# Patient Record
Sex: Female | Born: 1995
Health system: Southern US, Community
[De-identification: ages and names within clinical notes are randomized; demographics above are authoritative.]

## PROBLEM LIST (undated history)

## (undated) ENCOUNTER — Inpatient Hospital Stay (HOSPITAL_COMMUNITY): Payer: Self-pay

## (undated) DIAGNOSIS — F32A Depression, unspecified: Secondary | ICD-10-CM

## (undated) DIAGNOSIS — F419 Anxiety disorder, unspecified: Secondary | ICD-10-CM

## (undated) DIAGNOSIS — I10 Essential (primary) hypertension: Secondary | ICD-10-CM

## (undated) DIAGNOSIS — D649 Anemia, unspecified: Secondary | ICD-10-CM

## (undated) HISTORY — PX: KNEE SURGERY: SHX244

---
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
## 2016-12-01 DIAGNOSIS — Z3201 Encounter for pregnancy test, result positive: Secondary | ICD-10-CM | POA: Diagnosis not present

## 2016-12-27 ENCOUNTER — Encounter: Payer: Self-pay | Admitting: Family Medicine

## 2016-12-27 ENCOUNTER — Ambulatory Visit (INDEPENDENT_AMBULATORY_CARE_PROVIDER_SITE_OTHER): Payer: BLUE CROSS/BLUE SHIELD | Admitting: Family Medicine

## 2016-12-27 VITALS — BP 106/91 | HR 66 | Wt 122.0 lb

## 2016-12-27 DIAGNOSIS — Z3400 Encounter for supervision of normal first pregnancy, unspecified trimester: Secondary | ICD-10-CM | POA: Insufficient documentation

## 2016-12-27 DIAGNOSIS — O3680X0 Pregnancy with inconclusive fetal viability, not applicable or unspecified: Secondary | ICD-10-CM

## 2016-12-27 DIAGNOSIS — Z113 Encounter for screening for infections with a predominantly sexual mode of transmission: Secondary | ICD-10-CM | POA: Diagnosis not present

## 2016-12-27 DIAGNOSIS — Z3491 Encounter for supervision of normal pregnancy, unspecified, first trimester: Secondary | ICD-10-CM

## 2016-12-27 NOTE — Progress Notes (Signed)
DATING AND VIABILITY SONOGRAM   Eulah Pontrmani Liou is a 21 y.o. year old G1P0 with LMP Patient's last menstrual period was 10/30/2016 (exact date). which would correlate to  5944w2d weeks gestation.  She has regular menstrual cycles.   She is here today for a confirmatory initial sonogram.    GESTATION: SINGLETON     FETAL ACTIVITY:          Heart rate         170 bpm          The fetus is active  GESTATIONAL AGE AND  BIOMETRICS:  Gestational criteria: Estimated Date of Delivery: 08/06/17 by LMP        CROWN RUMP LENGTH           2.09 cm         8-4weeks                                                                               AVERAGE EGA(BY THIS SCAN):  8week 4 days weeks  WORKING EDD:[birth year]18 Armandina StammerJennifer Howard 12/27/2016 9:01 AM

## 2016-12-27 NOTE — Progress Notes (Signed)
  Subjective:  Stacie Harding is a G1P0 3590w2d being seen today for her first obstetrical visit.  Her obstetrical history is insignificant. Patient does intend to breast feed. Pregnancy history fully reviewed.  Patient reports nausea.  BP (!) 106/91   Pulse 66   Wt 122 lb (55.3 kg)   LMP 10/30/2016 (Exact Date)   HISTORY: OB History  Gravida Para Term Preterm AB Living  1            SAB TAB Ectopic Multiple Live Births               # Outcome Date GA Lbr Len/2nd Weight Sex Delivery Anes PTL Lv  1 Current               History reviewed. No pertinent past medical history.  Past Surgical History:  Procedure Laterality Date  . KNEE SURGERY      Family History  Problem Relation Age of Onset  . Diabetes Maternal Grandmother   . Hypertension Maternal Grandmother   . Sudden death Neg Hx   . Hyperlipidemia Neg Hx   . Heart attack Neg Hx      Exam    Uterus:     Pelvic Exam:    Perineum: No Hemorrhoids, Normal Perineum   Vulva: normal, Bartholin's, Urethra, Skene's normal   Vagina:  normal mucosa, normal discharge   Cervix: no cervical motion tenderness, no lesions and nulliparous appearance   Adnexa: normal adnexa and no mass, fullness, tenderness   Bony Pelvis: gynecoid  System: Breast:  normal appearance, no masses or tenderness, Inspection negative, No nipple retraction or dimpling, No nipple discharge or bleeding, No axillary or supraclavicular adenopathy, Normal to palpation without dominant masses   Skin: normal coloration and turgor, no rashes    Neurologic: oriented, normal, negative   Extremities: normal strength, tone, and muscle mass, no deformities, no erythema, induration, or nodules, no evidence of joint effusion, ROM of all joints is normal   HEENT PERRLA and extra ocular movement intact   Mouth/Teeth mucous membranes moist, pharynx normal without lesions   Neck supple and no masses   Cardiovascular: regular rate and rhythm, no murmurs or gallops   Respiratory:  appears well, vitals normal, no respiratory distress, acyanotic, normal RR, ear and throat exam is normal, neck free of mass or lymphadenopathy, chest clear, no wheezing, crepitations, rhonchi, normal symmetric air entry   Abdomen: soft, non-tender; bowel sounds normal; no masses,  no organomegaly   Urinary: urethral meatus normal      Assessment:    Pregnancy: G1P0 Patient Active Problem List   Diagnosis Date Noted  . Supervision of normal first pregnancy, antepartum 12/27/2016  . Left knee injury 02/21/2012      Plan:   1. Supervision of normal first pregnancy, antepartum Genetic Screening discussed Quad Screen: requested.  Ultrasound discussed; fetal survey: requested.  Follow up in 4 weeks.  -Obstetric Panel, Including HIV - Inheritest Society Guided - CULTURE, URINE COMPREHENSIVE - GC/Chlamydia probe amp ([place]     Problem list reviewed and updated. 75% of 30 min visit spent on counseling and coordination of care.     Levie HeritageJacob J Stinson 12/27/2016

## 2016-12-27 NOTE — Patient Instructions (Signed)

## 2016-12-28 LAB — GC/CHLAMYDIA PROBE AMP (~~LOC~~) NOT AT ARMC
Chlamydia: NEGATIVE
Neisseria Gonorrhea: NEGATIVE

## 2016-12-31 ENCOUNTER — Encounter: Payer: Self-pay | Admitting: Family Medicine

## 2016-12-31 DIAGNOSIS — R8271 Bacteriuria: Secondary | ICD-10-CM | POA: Insufficient documentation

## 2016-12-31 LAB — CULTURE, URINE COMPREHENSIVE

## 2017-01-11 LAB — INHERITEST SOCIETY GUIDED

## 2017-01-11 LAB — OBSTETRIC PANEL, INCLUDING HIV
Antibody Screen: NEGATIVE
Basophils Absolute: 0 10*3/uL (ref 0.0–0.2)
Basos: 0 %
EOS (ABSOLUTE): 0 10*3/uL (ref 0.0–0.4)
Eos: 1 %
HEMOGLOBIN: 12.4 g/dL (ref 11.1–15.9)
HIV Screen 4th Generation wRfx: NONREACTIVE
Hematocrit: 37.5 % (ref 34.0–46.6)
Hepatitis B Surface Ag: NEGATIVE
Immature Grans (Abs): 0 10*3/uL (ref 0.0–0.1)
Immature Granulocytes: 0 %
Lymphocytes Absolute: 1 10*3/uL (ref 0.7–3.1)
Lymphs: 20 %
MCH: 30.9 pg (ref 26.6–33.0)
MCHC: 33.1 g/dL (ref 31.5–35.7)
MCV: 94 fL (ref 79–97)
MONOCYTES: 5 %
Monocytes Absolute: 0.2 10*3/uL (ref 0.1–0.9)
NEUTROS PCT: 74 %
Neutrophils Absolute: 3.8 10*3/uL (ref 1.4–7.0)
Platelets: 214 10*3/uL (ref 150–379)
RBC: 4.01 x10E6/uL (ref 3.77–5.28)
RDW: 13.1 % (ref 12.3–15.4)
RPR Ser Ql: NONREACTIVE
Rh Factor: POSITIVE
Rubella Antibodies, IGG: 10.6 index (ref >0.99)
WBC: 5.1 10*3/uL (ref 3.4–10.8)

## 2017-01-24 ENCOUNTER — Ambulatory Visit (INDEPENDENT_AMBULATORY_CARE_PROVIDER_SITE_OTHER): Payer: BLUE CROSS/BLUE SHIELD | Admitting: Family Medicine

## 2017-01-24 VITALS — BP 130/70 | HR 68 | Wt 121.0 lb

## 2017-01-24 DIAGNOSIS — Z3402 Encounter for supervision of normal first pregnancy, second trimester: Secondary | ICD-10-CM

## 2017-01-24 DIAGNOSIS — Z3400 Encounter for supervision of normal first pregnancy, unspecified trimester: Secondary | ICD-10-CM

## 2017-01-24 NOTE — Progress Notes (Signed)
   PRENATAL VISIT NOTE  Subjective:  Stacie Harding is a 21 y.o. G1P0 at [redacted]w[redacted]d being seen today for ongoing prenatal care.  She is currently monitored for the following issues for this low-risk pregnancy and has Left knee injury; Supervision of normal first pregnancy, antepartum; and GBS bacteriuria on her problem list.  Patient reports no complaints.  Contractions: Not present. Vag. Bleeding: None.   . Denies leaking of fluid.   The following portions of the patient's history were reviewed and updated as appropriate: allergies, current medications, past family history, past medical history, past social history, past surgical history and problem list. Problem list updated.  Objective:   Vitals:   01/24/17 1104  BP: 130/70  Pulse: 68  Weight: 121 lb (54.9 kg)    Fetal Status: Fetal Heart Rate (bpm): 155         General:  Alert, oriented and cooperative. Patient is in no acute distress.  Skin: Skin is warm and dry. No rash noted.   Cardiovascular: Normal heart rate noted  Respiratory: Normal respiratory effort, no problems with respiration noted  Abdomen: Soft, gravid, appropriate for gestational age.  Pain/Pressure: Absent     Pelvic: Cervical exam deferred        Extremities: Normal range of motion.  Edema: None  Mental Status:  Normal mood and affect. Normal behavior. Normal judgment and thought content.   Assessment and Plan:  Pregnancy: G1P0 at [redacted]w[redacted]d  1. Supervision of normal first pregnancy, antepartum FHT normal - [place] MFM OB COMP + 14 WK; Future  Preterm labor symptoms and general obstetric precautions including but not limited to vaginal bleeding, contractions, leaking of fluid and fetal movement were reviewed in detail with the patient. Please refer to After Visit Summary for other counseling recommendations.  Return in about 8 weeks (around 03/21/2017) for OB f/u.   Levie Heritage, DO

## 2017-01-28 DIAGNOSIS — F99 Mental disorder, not otherwise specified: Secondary | ICD-10-CM | POA: Diagnosis not present

## 2017-01-29 ENCOUNTER — Encounter: Payer: Self-pay | Admitting: Family Medicine

## 2017-02-06 DIAGNOSIS — F99 Mental disorder, not otherwise specified: Secondary | ICD-10-CM | POA: Diagnosis not present

## 2017-02-13 DIAGNOSIS — F99 Mental disorder, not otherwise specified: Secondary | ICD-10-CM | POA: Diagnosis not present

## 2017-02-20 DIAGNOSIS — F99 Mental disorder, not otherwise specified: Secondary | ICD-10-CM | POA: Diagnosis not present

## 2017-02-27 DIAGNOSIS — F99 Mental disorder, not otherwise specified: Secondary | ICD-10-CM | POA: Diagnosis not present

## 2017-02-28 ENCOUNTER — Encounter: Payer: Self-pay | Admitting: Family Medicine

## 2017-03-05 ENCOUNTER — Encounter (HOSPITAL_COMMUNITY): Payer: Self-pay | Admitting: Family Medicine

## 2017-03-12 ENCOUNTER — Other Ambulatory Visit: Payer: Self-pay | Admitting: Family Medicine

## 2017-03-12 ENCOUNTER — Ambulatory Visit (HOSPITAL_COMMUNITY)
Admission: RE | Admit: 2017-03-12 | Discharge: 2017-03-12 | Disposition: A | Discharge status: Home / Self Care | Payer: BLUE CROSS/BLUE SHIELD | Source: Ambulatory Visit | Attending: Family Medicine | Admitting: Family Medicine

## 2017-03-12 DIAGNOSIS — Z3689 Encounter for other specified antenatal screening: Secondary | ICD-10-CM | POA: Diagnosis not present

## 2017-03-12 DIAGNOSIS — Z3400 Encounter for supervision of normal first pregnancy, unspecified trimester: Secondary | ICD-10-CM

## 2017-03-12 DIAGNOSIS — Z3A19 19 weeks gestation of pregnancy: Secondary | ICD-10-CM | POA: Insufficient documentation

## 2017-03-14 DIAGNOSIS — F99 Mental disorder, not otherwise specified: Secondary | ICD-10-CM | POA: Diagnosis not present

## 2017-03-20 DIAGNOSIS — F99 Mental disorder, not otherwise specified: Secondary | ICD-10-CM | POA: Diagnosis not present

## 2017-03-21 ENCOUNTER — Ambulatory Visit (INDEPENDENT_AMBULATORY_CARE_PROVIDER_SITE_OTHER): Payer: BLUE CROSS/BLUE SHIELD | Admitting: Family Medicine

## 2017-03-21 VITALS — BP 107/66 | HR 71 | Wt 136.0 lb

## 2017-03-21 DIAGNOSIS — Z3400 Encounter for supervision of normal first pregnancy, unspecified trimester: Secondary | ICD-10-CM

## 2017-03-21 NOTE — Progress Notes (Signed)
   PRENATAL VISIT NOTE  Subjective:  Stacie Harding is a 21 y.o. G1P0 at 4040w2d being seen today for ongoing prenatal care.  She is currently monitored for the following issues for this low-risk pregnancy and has Left knee injury; Supervision of normal first pregnancy, antepartum; and GBS bacteriuria on her problem list.  Patient reports no complaints.  Contractions: Not present. Vag. Bleeding: None.  Movement: Present. Denies leaking of fluid.   The following portions of the patient's history were reviewed and updated as appropriate: allergies, current medications, past family history, past medical history, past social history, past surgical history and problem list. Problem list updated.  Objective:   Vitals:   03/21/17 0846  BP: 107/66  Pulse: 71  Weight: 136 lb (61.7 kg)    Fetal Status: Fetal Heart Rate (bpm): 150 Fundal Height: 21 cm Movement: Present     General:  Alert, oriented and cooperative. Patient is in no acute distress.  Skin: Skin is warm and dry. No rash noted.   Cardiovascular: Normal heart rate noted  Respiratory: Normal respiratory effort, no problems with respiration noted  Abdomen: Soft, gravid, appropriate for gestational age.  Pain/Pressure: Absent     Pelvic: Cervical exam deferred        Extremities: Normal range of motion.  Edema: None  Mental Status:  Normal mood and affect. Normal behavior. Normal judgment and thought content.   Assessment and Plan:  Pregnancy: G1P0 at 6940w2d  1. Supervision of normal first pregnancy, antepartum FHT and FH normal. 28 week labs next visit. Declined quad  Preterm labor symptoms and general obstetric precautions including but not limited to vaginal bleeding, contractions, leaking of fluid and fetal movement were reviewed in detail with the patient. Please refer to After Visit Summary for other counseling recommendations.  Return in about 8 weeks (around 05/16/2017) for OB f/u, 2 hr GTT.   Levie HeritageJacob J Amita Atayde, DO

## 2017-04-17 DIAGNOSIS — F99 Mental disorder, not otherwise specified: Secondary | ICD-10-CM | POA: Diagnosis not present

## 2017-04-30 ENCOUNTER — Telehealth: Payer: Self-pay

## 2017-04-30 NOTE — Telephone Encounter (Signed)
Patient needs dental note.Fax[place] pregnancy dental letter to [facility] [place]. Armandina StammerJennifer Charly Hunton RN

## 2017-05-17 ENCOUNTER — Encounter: Payer: Self-pay | Admitting: Obstetrics and Gynecology

## 2017-05-17 ENCOUNTER — Ambulatory Visit (INDEPENDENT_AMBULATORY_CARE_PROVIDER_SITE_OTHER): Payer: BLUE CROSS/BLUE SHIELD | Admitting: Obstetrics and Gynecology

## 2017-05-17 VITALS — BP 114/66 | HR 78 | Wt 142.0 lb

## 2017-05-17 DIAGNOSIS — Z3490 Encounter for supervision of normal pregnancy, unspecified, unspecified trimester: Secondary | ICD-10-CM | POA: Diagnosis not present

## 2017-05-17 DIAGNOSIS — Z3400 Encounter for supervision of normal first pregnancy, unspecified trimester: Secondary | ICD-10-CM

## 2017-05-17 DIAGNOSIS — Z3491 Encounter for supervision of normal pregnancy, unspecified, first trimester: Secondary | ICD-10-CM | POA: Diagnosis not present

## 2017-05-17 DIAGNOSIS — R8271 Bacteriuria: Secondary | ICD-10-CM

## 2017-05-17 NOTE — Progress Notes (Signed)
Patient recently had tooth pulled and on antibiotics and pain medications from that. Armandina StammerJennifer Howard RNBSN

## 2017-05-17 NOTE — Progress Notes (Signed)
   PRENATAL VISIT NOTE  Subjective:  Stacie Harding is a 21 y.o. G1P0 at 2026w3d being seen today for ongoing prenatal care.  She is currently monitored for the following issues for this low-risk pregnancy and has Left knee injury; Supervision of normal first pregnancy, antepartum; and GBS bacteriuria on their problem list.  Patient reports no complaints.  Contractions: Not present. Vag. Bleeding: None.  Movement: Present. Denies leaking of fluid.   The following portions of the patient's history were reviewed and updated as appropriate: allergies, current medications, past family history, past medical history, past social history, past surgical history and problem list. Problem list updated.  Objective:   Vitals:   05/17/17 0830  BP: 114/66  Pulse: 78  Weight: 142 lb (64.4 kg)    Fetal Status: Fetal Heart Rate (bpm): 140 Fundal Height: 28 cm Movement: Present     General:  Alert, oriented and cooperative. Patient is in no acute distress.  Skin: Skin is warm and dry. No rash noted.   Cardiovascular: Normal heart rate noted  Respiratory: Normal respiratory effort, no problems with respiration noted  Abdomen: Soft, gravid, appropriate for gestational age.  Pain/Pressure: Absent     Pelvic: Cervical exam deferred        Extremities: Normal range of motion.  Edema: None  Mental Status:  Normal mood and affect. Normal behavior. Normal judgment and thought content.   Assessment and Plan:  Pregnancy: G1P0 at 8126w3d  1. Prenatal care, antepartum Patient is doing well without complaints Third trimester labs, glucola today Patient declined tdap - Glucose Tolerance, 2 Hours w/1 Hour - CBC - RPR - HIV antibody (with reflex)  2. Supervision of normal first pregnancy, antepartum   3. GBS bacteriuria Will provide prophylaxis in labor  Preterm labor symptoms and general obstetric precautions including but not limited to vaginal bleeding, contractions, leaking of fluid and fetal movement  were reviewed in detail with the patient. Please refer to After Visit Summary for other counseling recommendations.  Return in about 4 weeks (around 06/14/2017) for ROB.   Catalina AntiguaPeggy Tyre Beaver, MD

## 2017-05-18 LAB — CBC
Hematocrit: 31.4 % — ABNORMAL LOW (ref 34.0–46.6)
Hemoglobin: 10.7 g/dL — ABNORMAL LOW (ref 11.1–15.9)
MCH: 31 pg (ref 26.6–33.0)
MCHC: 34.1 g/dL (ref 31.5–35.7)
MCV: 91 fL (ref 79–97)
Platelets: 216 10*3/uL (ref 150–379)
RBC: 3.45 x10E6/uL — ABNORMAL LOW (ref 3.77–5.28)
RDW: 13.8 % (ref 12.3–15.4)
WBC: 5.1 10*3/uL (ref 3.4–10.8)

## 2017-05-18 LAB — GLUCOSE TOLERANCE, 2 HOURS W/ 1HR
GLUCOSE, 2 HOUR: 79 mg/dL (ref 65–152)
Glucose, 1 hour: 83 mg/dL (ref 65–179)
Glucose, Fasting: 68 mg/dL (ref 65–91)

## 2017-05-18 LAB — SYPHILIS: RPR W/REFLEX TO RPR TITER AND TREPONEMAL ANTIBODIES, TRADITIONAL SCREENING AND DIAGNOSIS ALGORITHM: RPR Ser Ql: NONREACTIVE

## 2017-05-18 LAB — HIV ANTIBODY (ROUTINE TESTING W REFLEX): HIV Screen 4th Generation wRfx: NONREACTIVE

## 2017-05-20 DIAGNOSIS — K090 Developmental odontogenic cysts: Secondary | ICD-10-CM | POA: Diagnosis not present

## 2017-06-04 NOTE — L&D Delivery Note (Signed)
Patient: Stacie Harding MRN: [id number]  GBS status: positive, adequate IAP given (penicllin x 4)  Patient is a 22 y.o. now G1P1001 s/p NSVD at 1372w6d, who was admitted for PROM. SROM ~ 50h 728m prior to delivery with clear fluid. S/p IOL with cytotec and Pitocin  Delivery Note At 12:23 AM a viable female was delivered via  (Presentation: vertex;  OA).  APGAR: 9, 9; weight  pending   Placenta status: intact, 3-vessek.  Cord: 3-vessel  Patient pushed for about 15 minutes. Arrived in room, head crowning, and spontaneously delivered OA. No nuchal cord present. Shoulders and body delivered in usual fashion. Infant with spontaneous cry, placed on mother's abdomen, dried and bulb suctioned. Cord clamped x 2 after 1-minute delay, and cut by family member. Cord blood drawn. Placenta delivered spontaneously with gentle cord traction. Fundus firm with massage and Pitocin. Perineum inspected and found to have no lacerations  Anesthesia:  Epidural Episiotomy:  none Lacerations:  none Suture Repair:  none Est. Blood Loss (mL):  150  Mom to postpartum.  Baby to Couplet care / Skin to Skin.  Raynelle FanningJulie P. Gracelee Stemmler, MD OB Fellow 08/05/17, 12:42 AM

## 2017-06-14 ENCOUNTER — Ambulatory Visit (INDEPENDENT_AMBULATORY_CARE_PROVIDER_SITE_OTHER): Payer: Medicaid Other | Admitting: Obstetrics & Gynecology

## 2017-06-14 ENCOUNTER — Encounter: Payer: Self-pay | Admitting: Obstetrics & Gynecology

## 2017-06-14 DIAGNOSIS — Z3400 Encounter for supervision of normal first pregnancy, unspecified trimester: Secondary | ICD-10-CM

## 2017-06-14 NOTE — Progress Notes (Signed)
   PRENATAL VISIT NOTE  Subjective:  Stacie Harding is a 22 y.o. G1P0 at 3839w3d being seen today for ongoing prenatal care.  She is currently monitored for the following issues for this low-risk pregnancy and has Left knee injury; Supervision of normal first pregnancy, antepartum; and GBS bacteriuria on their problem list.  Patient reports no complaints.  Contractions: Irritability. Vag. Bleeding: None.  Movement: Present. Denies leaking of fluid.   The following portions of the patient's history were reviewed and updated as appropriate: allergies, current medications, past family history, past medical history, past social history, past surgical history and problem list. Problem list updated.  Objective:   Vitals:   06/14/17 0837  BP: 114/62  Pulse: 79  Weight: 151 lb (68.5 kg)    Fetal Status: Fetal Heart Rate (bpm): 140   Movement: Present     General:  Alert, oriented and cooperative. Patient is in no acute distress.  Skin: Skin is warm and dry. No rash noted.   Cardiovascular: Normal heart rate noted  Respiratory: Normal respiratory effort, no problems with respiration noted  Abdomen: Soft, gravid, appropriate for gestational age.  Pain/Pressure: Absent     Pelvic: Cervical exam deferred        Extremities: Normal range of motion.     Mental Status:  Normal mood and affect. Normal behavior. Normal judgment and thought content.   Assessment and Plan:  Pregnancy: G1P0 at 7239w3d  1. Supervision of normal first pregnancy, antepartum Pt to receive Tdap and flu vaccine today.  She is undecided re a peditrician.    Preterm labor symptoms and general obstetric precautions including but not limited to vaginal bleeding, contractions, leaking of fluid and fetal movement were reviewed in detail with the patient. Please refer to After Visit Summary for other counseling recommendations.  Return in about 4 weeks (around 07/12/2017).   Willodean Rosenthalarolyn Harraway-Smith, MD

## 2017-07-12 ENCOUNTER — Encounter: Payer: Medicaid Other | Admitting: Family Medicine

## 2017-07-12 ENCOUNTER — Other Ambulatory Visit (HOSPITAL_COMMUNITY)
Admission: RE | Admit: 2017-07-12 | Discharge: 2017-07-12 | Disposition: A | Discharge status: Home / Self Care | Payer: Medicaid Other | Source: Ambulatory Visit | Attending: Family Medicine | Admitting: Family Medicine

## 2017-07-12 ENCOUNTER — Ambulatory Visit (INDEPENDENT_AMBULATORY_CARE_PROVIDER_SITE_OTHER): Payer: Medicaid Other | Admitting: Family Medicine

## 2017-07-12 VITALS — BP 120/70 | HR 96 | Wt 157.0 lb

## 2017-07-12 DIAGNOSIS — Z3A00 Weeks of gestation of pregnancy not specified: Secondary | ICD-10-CM | POA: Diagnosis not present

## 2017-07-12 DIAGNOSIS — Z3400 Encounter for supervision of normal first pregnancy, unspecified trimester: Secondary | ICD-10-CM

## 2017-07-12 DIAGNOSIS — R8271 Bacteriuria: Secondary | ICD-10-CM

## 2017-07-12 NOTE — Progress Notes (Signed)
Pt states that she has had a cold this week. Pt states currently just having cough.

## 2017-07-12 NOTE — Progress Notes (Signed)
   PRENATAL VISIT NOTE  Subjective:  Stacie Harding is a 22 y.o. G1P0 at 2254w3d being seen today for ongoing prenatal care.  She is currently monitored for the following issues for this low-risk pregnancy and has Left knee injury; Supervision of normal first pregnancy, antepartum; and GBS bacteriuria on their problem list.  Patient reports mild constant pain in right abdomen..  Contractions: Irregular. Vag. Bleeding: None.  Movement: Present. Denies leaking of fluid.   The following portions of the patient's history were reviewed and updated as appropriate: allergies, current medications, past family history, past medical history, past social history, past surgical history and problem list. Problem list updated.  Objective:   Vitals:   07/12/17 1036  BP: 120/70  Pulse: 96  Weight: 157 lb (71.2 kg)    Fetal Status: Fetal Heart Rate (bpm): 135   Movement: Present     General:  Alert, oriented and cooperative. Patient is in no acute distress.  Skin: Skin is warm and dry. No rash noted.   Cardiovascular: Normal heart rate noted  Respiratory: Normal respiratory effort, no problems with respiration noted  Abdomen: Soft, gravid, appropriate for gestational age.  Pain/Pressure: Present  Tenderness in RLQ. No rebound or guarding.   Pelvic: Cervical exam deferred        Extremities: Normal range of motion.     Mental Status:  Normal mood and affect. Normal behavior. Normal judgment and thought content.   Assessment and Plan:  Pregnancy: G1P0 at 5054w3d  1. Supervision of normal first pregnancy, antepartum FHT and FH normal.   2. GBS bacteriuria Intrapartum prophylaxis  3. Abd Pain in pregnancy I don't think this is appendicitis as pain is lower than normal for appendicitis at this GA. ? Muscle strain. Will watch for now. Pt advised to return or call if worsens.  Preterm labor symptoms and general obstetric precautions including but not limited to vaginal bleeding, contractions, leaking of  fluid and fetal movement were reviewed in detail with the patient. Please refer to After Visit Summary for other counseling recommendations.  Return in about 2 weeks (around 07/26/2017) for OB f/u.   Levie HeritageJacob J Stinson, DO

## 2017-07-12 NOTE — Progress Notes (Signed)
Pt states she was told she needed an u/s for size>dates. Pt advised to discuss with provider today.

## 2017-07-22 LAB — URINE CYTOLOGY ANCILLARY ONLY
Chlamydia: NEGATIVE
Neisseria Gonorrhea: NEGATIVE

## 2017-07-26 ENCOUNTER — Ambulatory Visit (INDEPENDENT_AMBULATORY_CARE_PROVIDER_SITE_OTHER): Payer: Medicaid Other | Admitting: Family Medicine

## 2017-07-26 ENCOUNTER — Encounter: Payer: Self-pay | Admitting: Family Medicine

## 2017-07-26 VITALS — BP 124/79 | HR 74 | Wt 162.0 lb

## 2017-07-26 DIAGNOSIS — R8271 Bacteriuria: Secondary | ICD-10-CM

## 2017-07-26 DIAGNOSIS — Z3400 Encounter for supervision of normal first pregnancy, unspecified trimester: Secondary | ICD-10-CM

## 2017-07-26 NOTE — Progress Notes (Signed)
   PRENATAL VISIT NOTE  Subjective:  Stacie Harding is a 22 y.o. G1P0 at 5742w3d being seen today for ongoing prenatal care.  She is currently monitored for the following issues for this low-risk pregnancy and has Left knee injury; Supervision of normal first pregnancy, antepartum; and GBS bacteriuria on their problem list.  Patient reports no complaints.  Contractions: Irritability. Vag. Bleeding: None.  Movement: Present. Denies leaking of fluid.   The following portions of the patient's history were reviewed and updated as appropriate: allergies, current medications, past family history, past medical history, past social history, past surgical history and problem list. Problem list updated.  Objective:   Vitals:   07/26/17 1019  BP: 124/79  Pulse: 74  Weight: 162 lb (73.5 kg)    Fetal Status: Fetal Heart Rate (bpm): 145 Fundal Height: 38 cm Movement: Present     General:  Alert, oriented and cooperative. Patient is in no acute distress.  Skin: Skin is warm and dry. No rash noted.   Cardiovascular: Normal heart rate noted  Respiratory: Normal respiratory effort, no problems with respiration noted  Abdomen: Soft, gravid, appropriate for gestational age.  Pain/Pressure: Present     Pelvic: Cervical exam deferred        Extremities: Normal range of motion.  Edema: None  Mental Status:  Normal mood and affect. Normal behavior. Normal judgment and thought content.   Assessment and Plan:  Pregnancy: G1P0 at 5542w3d  1. Supervision of normal first pregnancy, antepartum FHT and FH normal  2. GBS bacteriuria Intrapartum prophylaxis  Term labor symptoms and general obstetric precautions including but not limited to vaginal bleeding, contractions, leaking of fluid and fetal movement were reviewed in detail with the patient. Please refer to After Visit Summary for other counseling recommendations.  No Follow-up on file.   Levie HeritageJacob J Marque Bango, DO

## 2017-07-31 ENCOUNTER — Other Ambulatory Visit: Payer: Self-pay

## 2017-07-31 ENCOUNTER — Encounter (HOSPITAL_BASED_OUTPATIENT_CLINIC_OR_DEPARTMENT_OTHER): Payer: Self-pay | Admitting: Emergency Medicine

## 2017-07-31 ENCOUNTER — Inpatient Hospital Stay (HOSPITAL_BASED_OUTPATIENT_CLINIC_OR_DEPARTMENT_OTHER)
Admission: EM | Admit: 2017-07-31 | Discharge: 2017-08-01 | Disposition: A | Discharge status: Other Acute Inpatient Hospital | Payer: Medicaid Other | Attending: Obstetrics & Gynecology | Admitting: Obstetrics & Gynecology

## 2017-07-31 DIAGNOSIS — Z3A37 37 weeks gestation of pregnancy: Secondary | ICD-10-CM | POA: Insufficient documentation

## 2017-07-31 DIAGNOSIS — R1084 Generalized abdominal pain: Secondary | ICD-10-CM | POA: Insufficient documentation

## 2017-07-31 DIAGNOSIS — Z720 Tobacco use: Secondary | ICD-10-CM | POA: Insufficient documentation

## 2017-07-31 DIAGNOSIS — O471 False labor at or after 37 completed weeks of gestation: Secondary | ICD-10-CM | POA: Insufficient documentation

## 2017-07-31 DIAGNOSIS — O26893 Other specified pregnancy related conditions, third trimester: Secondary | ICD-10-CM

## 2017-07-31 DIAGNOSIS — O9989 Other specified diseases and conditions complicating pregnancy, childbirth and the puerperium: Secondary | ICD-10-CM | POA: Diagnosis present

## 2017-07-31 DIAGNOSIS — R109 Unspecified abdominal pain: Secondary | ICD-10-CM

## 2017-07-31 MED ORDER — SODIUM CHLORIDE 0.9 % IV BOLUS (SEPSIS)
1000.0000 mL | Freq: Once | INTRAVENOUS | Status: AC
  Administered 2017-07-31: 1000 mL via INTRAVENOUS

## 2017-07-31 NOTE — MAU Note (Signed)
Pt transferred via [facility]. Pt reports contractions every 3-4 mins since 6:30pm. Pt denies LOF or vaginal bleeding. Reports good fetal movement. States the ED dr said she was 2cm

## 2017-07-31 NOTE — Progress Notes (Addendum)
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

## 2017-07-31 NOTE — ED Triage Notes (Signed)
Pt is [redacted] weeks pregnant and has been having contractions since 630 and are about every 4 mins per pt.

## 2017-07-31 NOTE — ED Notes (Signed)
[facility] contacted and sending a truck.

## 2017-07-31 NOTE — ED Notes (Signed)
Report given to [facility] ETA 20 min 

## 2017-07-31 NOTE — ED Notes (Signed)
Called [facility] Delice Bison([facility]) for transport to [facility].

## 2017-07-31 NOTE — ED Provider Notes (Signed)
Emergency Department Provider Note   I have reviewed the triage vital signs and the nursing notes.   HISTORY  Chief Complaint Contractions   HPI Stacie Harding is a 22 y.o. female G1P0 at 6739 weeks presents to the ED with frequent contraction pain (every 3-4 minutes) over the last 3 hours. Patient is followed by Dr. Adrian BlackwaterStinson with OB. No complications in the pregnancy thus far. Patient denies any vaginal bleeding or rush of fluid. Cramping abdominal pain is intermittent and severe. Denies dysuria. Patient has been at home with contractions for 3 hours alone. When mom arrived she insisted on transport to the ED and [facility]. Denies any rectal pain/pressure. Denies feeling need to push.   History reviewed. No pertinent past medical history.  Patient Active Problem List   Diagnosis Date Noted  . GBS bacteriuria 12/31/2016  . Supervision of normal first pregnancy, antepartum 12/27/2016  . Left knee injury 02/21/2012    Past Surgical History:  Procedure Laterality Date  . KNEE SURGERY        Allergies Patient has no known allergies.  Family History  Problem Relation Age of Onset  . Diabetes Maternal Grandmother   . Hypertension Maternal Grandmother   . Sudden death Neg Hx   . Hyperlipidemia Neg Hx   . Heart attack Neg Hx     Social History Social History   Tobacco Use  . Smoking status: Light Tobacco Smoker  . Smokeless tobacco: Never Used  Substance Use Topics  . Alcohol use: No  . Drug use: No    Review of Systems  Constitutional: No fever/chills Eyes: No visual changes. ENT: No sore throat. Cardiovascular: Denies chest pain. Respiratory: Denies shortness of breath. Gastrointestinal: Positive cramping abdominal pain.  No nausea, no vomiting.  No diarrhea.  No constipation. Genitourinary: Negative for dysuria. Musculoskeletal: Negative for back pain. Skin: Negative for rash. Neurological: Negative for  headaches, focal weakness or numbness.  10-point ROS otherwise negative.  ____________________________________________   PHYSICAL EXAM:  VITAL SIGNS: ED Triage Vitals  Enc Vitals Group     BP 07/31/17 2120 140/75     Pulse Rate 07/31/17 2120 89     Resp 07/31/17 2120 16     Temp 07/31/17 2115 97.8 F (36.6 C)     Temp Source 07/31/17 2115 Oral     SpO2 07/31/17 2120 100 %     Pain Score 07/31/17 2117 3   Constitutional: Alert and oriented. Appears slightly uncomfortable but no acute distress.  Eyes: Conjunctivae are normal.  Head: Atraumatic. Nose: No congestion/rhinnorhea. Mouth/Throat: Mucous membranes are moist.  Neck: No stridor.  Cardiovascular: Normal rate, regular rhythm. Good peripheral circulation. Grossly normal heart sounds.   Respiratory: Normal respiratory effort.  No retractions. Lungs CTAB. Gastrointestinal: Soft with gravid abdomen. Sterile digital exam shows a cervix that is approximately 2 cm and 50% effaced.  Musculoskeletal: No lower extremity tenderness nor edema. No gross deformities of extremities. Neurologic:  Normal speech and language. No gross focal neurologic deficits are appreciated.  Skin:  Skin is warm, dry and intact. No rash noted.  ____________________________________________   LABS (all labs ordered are listed, but only abnormal results are displayed)  Labs Reviewed  URINALYSIS, ROUTINE W REFLEX MICROSCOPIC   ____________________________________________   PROCEDURES  Procedure(s) performed:   Procedures  EMERGENCY DEPARTMENT US PREGNANCY "Study: Limited Ultrasound of the Pelvis for Pregnancy"  INDICATIONS:Pregnancy(required) and Abdominal or pelvic pain Multiple views of the uterus  and pelvic cavity were obtained in real-time with a multi-frequency probe.  APPROACH:Transabdominal   PERFORMED BY: Myself  LIMITATIONS: none  PREGNANCY FREE FLUID: None  ADNEXAL FINDINGS:Left ovary not seen and Right ovary not  seen  PREGNANCY FINDINGS: Head visualized down and seated in the pelvis.   INTERPRETATION: Fetal heart activity seen with head down.   GESTATIONAL AGE, ESTIMATE: 39 wks  FETAL HEART RATE: 155  CPT Codes:  76815-26 (transabdominal OB)  768-26-52 (transvaginal OB, Reduced level of service for incomplete exam)  ____________________________________________   INITIAL IMPRESSION / ASSESSMENT AND PLAN / ED COURSE  Pertinent labs & imaging results that were available during my care of the patient were reviewed by me and considered in my medical decision making (see chart for details).  At [redacted] weeks pregnant with 3 hours of contractions every 3-4 minutes.  On my sterile bimanual exam she is 2 cm dilated and 50% effaced.  No pooling fluid in the vaginal vault and no blood.  I spoke with the OB/GYN who [facility] to [facility]. Will call for transport. Obtain UA if able and will start IVF bolus.   Patient reevaluated immediately prior to discharge with EMS.  Contractions have not increased.  Good fetal heart variability.  No concern for imminent delivery.  Patient clear for transport.  ___________________________________________  FINAL CLINICAL IMPRESSION(S) / ED DIAGNOSES  Final diagnoses:  Abdominal pain during pregnancy in third trimester    MEDICATIONS GIVEN DURING THIS VISIT:  Medications  sodium chloride 0.9 % bolus 1,000 mL (1,000 mLs Intravenous New Bag/Given 07/31/17 2127)    Note:  This document was prepared using Dragon voice recognition software and may include unintentional dictation errors.  Alona Bene, MD Emergency Medicine   Janese Radabaugh, Arlyss Repress, MD 07/31/17 [phone]

## 2017-07-31 NOTE — ED Notes (Signed)
ED Provider at bedside. 

## 2017-08-01 ENCOUNTER — Ambulatory Visit (INDEPENDENT_AMBULATORY_CARE_PROVIDER_SITE_OTHER): Payer: Medicaid Other | Admitting: Family Medicine

## 2017-08-01 VITALS — BP 135/81 | HR 81 | Wt 166.0 lb

## 2017-08-01 DIAGNOSIS — R8271 Bacteriuria: Secondary | ICD-10-CM

## 2017-08-01 DIAGNOSIS — Z3400 Encounter for supervision of normal first pregnancy, unspecified trimester: Secondary | ICD-10-CM

## 2017-08-01 DIAGNOSIS — Z3403 Encounter for supervision of normal first pregnancy, third trimester: Secondary | ICD-10-CM

## 2017-08-01 NOTE — Discharge Instructions (Signed)
Vaginal Delivery Vaginal delivery means that you will give birth by pushing your baby out of your birth canal (vagina). A team of health care providers will help you before, during, and after vaginal delivery. Birth experiences are unique for every woman and every pregnancy, and birth experiences vary depending on where you choose to give birth. What should I do to prepare for my baby's birth? Before your baby is born, it is important to talk with your health care provider about:  Your labor and delivery preferences. These may include: ? Medicines that you may be given. ? How you will manage your pain. This might include non-medical pain relief techniques or injectable pain relief such as epidural analgesia. ? How you and your baby will be monitored during labor and delivery. ? Who may be in the labor and delivery room with you. ? Your feelings about surgical delivery of your baby (cesarean delivery, or C-section) if this becomes necessary. ? Your feelings about receiving donated blood through an IV tube (blood transfusion) if this becomes necessary.  Whether you are able: ? To take pictures or videos of the birth. ? To eat during labor and delivery. ? To move around, walk, or change positions during labor and delivery.  What to expect after your baby is born, such as: ? Whether delayed umbilical cord clamping and cutting is offered. ? Who will care for your baby right after birth. ? Medicines or tests that may be recommended for your baby. ? Whether breastfeeding is supported in your hospital or birth center. ? How long you will be in the hospital or birth center.  How any medical conditions you have may affect your baby or your labor and delivery experience.  To prepare for your baby's birth, you should also:  Attend all of your health care visits before delivery (prenatal visits) as recommended by your health care provider. This is important.  Prepare your home for your baby's  arrival. Make sure that you have: ? Diapers. ? Baby clothing. ? Feeding equipment. ? Safe sleeping arrangements for you and your baby.  Install a car seat in your vehicle. Have your car seat checked by a certified car seat installer to make sure that it is installed safely.  Think about who will help you with your new baby at home for at least the first several weeks after delivery.  What can I expect when I arrive at the birth center or hospital? Once you are in labor and have been admitted into the hospital or birth center, your health care provider may:  Review your pregnancy history and any concerns you have.  Insert an IV tube into one of your veins. This is used to give you fluids and medicines.  Check your blood pressure, pulse, temperature, and heart rate (vital signs).  Check whether your bag of water (amniotic sac) has broken (ruptured).  Talk with you about your birth plan and discuss pain control options.  Monitoring Your health care provider may monitor your contractions (uterine monitoring) and your baby's heart rate (fetal monitoring). You may need to be monitored:  Often, but not continuously (intermittently).  All the time or for long periods at a time (continuously). Continuous monitoring may be needed if: ? You are taking certain medicines, such as medicine to relieve pain or make your contractions stronger. ? You have pregnancy or labor complications.  Monitoring may be done by:  Placing a special stethoscope or a handheld monitoring device on your abdomen to   check your baby's heartbeat, and feeling your abdomen for contractions. This method of monitoring does not continuously record your baby's heartbeat or your contractions.  Placing monitors on your abdomen (external monitors) to record your baby's heartbeat and the frequency and length of contractions. You may not have to wear external monitors all the time.  Placing monitors inside of your uterus  (internal monitors) to record your baby's heartbeat and the frequency, length, and strength of your contractions. ? Your health care provider may use internal monitors if he or she needs more information about the strength of your contractions or your baby's heart rate. ? Internal monitors are put in place by passing a thin, flexible wire through your vagina and into your uterus. Depending on the type of monitor, it may remain in your uterus or on your baby's head until birth. ? Your health care provider will discuss the benefits and risks of internal monitoring with you and will ask for your permission before inserting the monitors.  Telemetry. This is a type of continuous monitoring that can be done with external or internal monitors. Instead of having to stay in bed, you are able to move around during telemetry. Ask your health care provider if telemetry is an option for you.  Physical exam Your health care provider may perform a physical exam. This may include:  Checking whether your baby is positioned: ? With the head toward your vagina (head-down). This is most common. ? With the head toward the top of your uterus (head-up or breech). If your baby is in a breech position, your health care provider may try to turn your baby to a head-down position so you can deliver vaginally. If it does not seem that your baby can be born vaginally, your provider may recommend surgery to deliver your baby. In rare cases, you may be able to deliver vaginally if your baby is head-up (breech delivery). ? Lying sideways (transverse). Babies that are lying sideways cannot be delivered vaginally.  Checking your cervix to determine: ? Whether it is thinning out (effacing). ? Whether it is opening up (dilating). ? How low your baby has moved into your birth canal.  What are the three stages of labor and delivery?  Normal labor and delivery is divided into the following three stages: Stage 1  Stage 1 is the  longest stage of labor, and it can last for hours or days. Stage 1 includes: ? Early labor. This is when contractions may be irregular, or regular and mild. Generally, early labor contractions are more than 10 minutes apart. ? Active labor. This is when contractions get longer, more regular, more frequent, and more intense. ? The transition phase. This is when contractions happen very close together, are very intense, and may last longer than during any other part of labor.  Contractions generally feel mild, infrequent, and irregular at first. They get stronger, more frequent (about every 2-3 minutes), and more regular as you progress from early labor through active labor and transition.  Many women progress through stage 1 naturally, but you may need help to continue making progress. If this happens, your health care provider may talk with you about: ? Rupturing your amniotic sac if it has not ruptured yet. ? Giving you medicine to help make your contractions stronger and more frequent.  Stage 1 ends when your cervix is completely dilated to 4 inches (10 cm) and completely effaced. This happens at the end of the transition phase. Stage 2  Once   your cervix is completely effaced and dilated to 4 inches (10 cm), you may start to feel an urge to push. It is common for the body to naturally take a rest before feeling the urge to push, especially if you received an epidural or certain other pain medicines. This rest period may last for up to 1-2 hours, depending on your unique labor experience.  During stage 2, contractions are generally less painful, because pushing helps relieve contraction pain. Instead of contraction pain, you may feel stretching and burning pain, especially when the widest part of your baby's head passes through the vaginal opening (crowning).  Your health care provider will closely monitor your pushing progress and your baby's progress through the vagina during stage 2.  Your  health care provider may massage the area of skin between your vaginal opening and anus (perineum) or apply warm compresses to your perineum. This helps it stretch as the baby's head starts to crown, which can help prevent perineal tearing. ? In some cases, an incision may be made in your perineum (episiotomy) to allow the baby to pass through the vaginal opening. An episiotomy helps to make the opening of the vagina larger to allow more room for the baby to fit through.  It is very important to breathe and focus so your health care provider can control the delivery of your baby's head. Your health care provider may have you decrease the intensity of your pushing, to help prevent perineal tearing.  After delivery of your baby's head, the shoulders and the rest of the body generally deliver very quickly and without difficulty.  Once your baby is delivered, the umbilical cord may be cut right away, or this may be delayed for 1-2 minutes, depending on your baby's health. This may vary among health care providers, hospitals, and birth centers.  If you and your baby are healthy enough, your baby may be placed on your chest or abdomen to help maintain the baby's temperature and to help you bond with each other. Some mothers and babies start breastfeeding at this time. Your health care team will dry your baby and help keep your baby warm during this time.  Your baby may need immediate care if he or she: ? Showed signs of distress during labor. ? Has a medical condition. ? Was born too early (prematurely). ? Had a bowel movement before birth (meconium). ? Shows signs of difficulty transitioning from being inside the uterus to being outside of the uterus. If you are planning to breastfeed, your health care team will help you begin a feeding. Stage 3  The third stage of labor starts immediately after the birth of your baby and ends after you deliver the placenta. The placenta is an organ that develops  during pregnancy to provide oxygen and nutrients to your baby in the womb.  Delivering the placenta may require some pushing, and you may have mild contractions. Breastfeeding can stimulate contractions to help you deliver the placenta.  After the placenta is delivered, your uterus should tighten (contract) and become firm. This helps to stop bleeding in your uterus. To help your uterus contract and to control bleeding, your health care provider may: ? Give you medicine by injection, through an IV tube, by mouth, or through your rectum (rectally). ? Massage your abdomen or perform a vaginal exam to remove any blood clots that are left in your uterus. ? Empty your bladder by placing a thin, flexible tube (catheter) into your bladder. ? Encourage   you to breastfeed your baby. After labor is over, you and your baby will be monitored closely to ensure that you are both healthy until you are ready to go home. Your health care team will teach you how to care for yourself and your baby. This information is not intended to replace advice given to you by your health care provider. Make sure you discuss any questions you have with your health care provider. Document Released: 02/28/2008 Document Revised: 12/09/2015 Document Reviewed: 06/05/2015 Elsevier Interactive Patient Education  2018 [employer]  

## 2017-08-01 NOTE — Progress Notes (Signed)
Patient was seen in MAU earlier this morning for contractions. Armandina StammerJennifer Howard RN

## 2017-08-01 NOTE — Progress Notes (Signed)
   PRENATAL VISIT NOTE  Subjective:  Stacie Harding is a 22 y.o. G1P0 at 6667w2d being seen today for ongoing prenatal care.  She is currently monitored for the following issues for this low-risk pregnancy and has Left knee injury; Supervision of normal first pregnancy, antepartum; and GBS bacteriuria on their problem list.  Patient reports no complaints.  Contractions: Irritability. Vag. Bleeding: None.  Movement: Present. Denies leaking of fluid.   The following portions of the patient's history were reviewed and updated as appropriate: allergies, current medications, past family history, past medical history, past social history, past surgical history and problem list. Problem list updated.  Objective:   Vitals:   08/01/17 0827  BP: 135/81  Pulse: 81  Weight: 166 lb (75.3 kg)    Fetal Status: Fetal Heart Rate (bpm): 140 Fundal Height: 39 cm Movement: Present  Presentation: Vertex  General:  Alert, oriented and cooperative. Patient is in no acute distress.  Skin: Skin is warm and dry. No rash noted.   Cardiovascular: Normal heart rate noted  Respiratory: Normal respiratory effort, no problems with respiration noted  Abdomen: Soft, gravid, appropriate for gestational age.  Pain/Pressure: Present     Pelvic: Cervical exam deferred Dilation: 2.5 Effacement (%): 70 Station: -2  Extremities: Normal range of motion.  Edema: Trace  Mental Status:  Normal mood and affect. Normal behavior. Normal judgment and thought content.   Assessment and Plan:  Pregnancy: G1P0 at 767w2d  1. Supervision of normal first pregnancy, antepartum FHT and FH normal  2. GBS bacteriuria Intrapartum prophylaxis  Term labor symptoms and general obstetric precautions including but not limited to vaginal bleeding, contractions, leaking of fluid and fetal movement were reviewed in detail with the patient. Please refer to After Visit Summary for other counseling recommendations.  No Follow-up on file.   Levie HeritageJacob J  Stinson, DO

## 2017-08-01 NOTE — MAU Note (Signed)
I have communicated with Wynelle BourgeoisMarie Williams, CNM and reviewed vital signs:  Vitals:   07/31/17 2240 08/01/17 0054  BP: 114/68 120/76  Pulse: 88 84  Resp: 16   Temp: 98.8 F (37.1 C)   SpO2: 100%     Vaginal exam:  Dilation: 2 Effacement (%): 70 Station: Ballotable Presentation: Vertex Exam by:: Zenia ResidesNikki Jarrid Lienhard, RN [phone],   Also reviewed contraction pattern and that non-stress test is reactive.  It has been documented that patient is contracting every 2-4 minutes with no cervical change over 1 hour not indicating active labor.  Patient denies any other complaints.  Based on this report provider has given order for discharge.  A discharge order and diagnosis entered by a provider.   Labor discharge instructions reviewed with patient.

## 2017-08-04 ENCOUNTER — Inpatient Hospital Stay (HOSPITAL_COMMUNITY): Payer: Medicaid Other | Admitting: Anesthesiology

## 2017-08-04 ENCOUNTER — Other Ambulatory Visit: Payer: Self-pay

## 2017-08-04 ENCOUNTER — Inpatient Hospital Stay (HOSPITAL_COMMUNITY)
Admission: AD | Admit: 2017-08-04 | Discharge: 2017-08-06 | DRG: 807 | Disposition: A | Discharge status: Home / Self Care | Payer: Medicaid Other | Source: Ambulatory Visit | Attending: Obstetrics & Gynecology | Admitting: Obstetrics & Gynecology

## 2017-08-04 ENCOUNTER — Encounter (HOSPITAL_COMMUNITY): Payer: Self-pay | Admitting: *Deleted

## 2017-08-04 DIAGNOSIS — O4292 Full-term premature rupture of membranes, unspecified as to length of time between rupture and onset of labor: Principal | ICD-10-CM | POA: Diagnosis present

## 2017-08-04 DIAGNOSIS — Z3400 Encounter for supervision of normal first pregnancy, unspecified trimester: Secondary | ICD-10-CM

## 2017-08-04 DIAGNOSIS — O26893 Other specified pregnancy related conditions, third trimester: Secondary | ICD-10-CM | POA: Diagnosis present

## 2017-08-04 DIAGNOSIS — O99334 Smoking (tobacco) complicating childbirth: Secondary | ICD-10-CM | POA: Diagnosis present

## 2017-08-04 DIAGNOSIS — O4210 Premature rupture of membranes, onset of labor more than 24 hours following rupture, unspecified weeks of gestation: Secondary | ICD-10-CM | POA: Diagnosis present

## 2017-08-04 DIAGNOSIS — F17200 Nicotine dependence, unspecified, uncomplicated: Secondary | ICD-10-CM | POA: Diagnosis present

## 2017-08-04 DIAGNOSIS — Z3A39 39 weeks gestation of pregnancy: Secondary | ICD-10-CM | POA: Diagnosis not present

## 2017-08-04 DIAGNOSIS — R030 Elevated blood-pressure reading, without diagnosis of hypertension: Secondary | ICD-10-CM | POA: Diagnosis present

## 2017-08-04 DIAGNOSIS — O99824 Streptococcus B carrier state complicating childbirth: Secondary | ICD-10-CM | POA: Diagnosis present

## 2017-08-04 LAB — CBC
HCT: 29.3 % — ABNORMAL LOW (ref 36.0–46.0)
Hemoglobin: 10 g/dL — ABNORMAL LOW (ref 12.0–15.0)
MCH: 29.1 pg (ref 26.0–34.0)
MCHC: 34.1 g/dL (ref 30.0–36.0)
MCV: 85.2 fL (ref 78.0–100.0)
Platelets: 188 10*3/uL (ref 150–400)
RBC: 3.44 MIL/uL — AB (ref 3.87–5.11)
RDW: 12.9 % (ref 11.5–15.5)
WBC: 4.7 10*3/uL (ref 4.0–10.5)

## 2017-08-04 LAB — COMPREHENSIVE METABOLIC PANEL
ALK PHOS: 532 U/L — AB (ref 38–126)
ALT: 11 U/L — AB (ref 14–54)
ANION GAP: 9 (ref 5–15)
AST: 25 U/L (ref 15–41)
Albumin: 2.8 g/dL — ABNORMAL LOW (ref 3.5–5.0)
BILIRUBIN TOTAL: 0.6 mg/dL (ref 0.3–1.2)
BUN: 8 mg/dL (ref 6–20)
CALCIUM: 8.4 mg/dL — AB (ref 8.9–10.3)
CO2: 21 mmol/L — ABNORMAL LOW (ref 22–32)
CREATININE: 0.83 mg/dL (ref 0.44–1.00)
Chloride: 105 mmol/L (ref 101–111)
Glucose, Bld: 81 mg/dL (ref 65–99)
Potassium: 3.5 mmol/L (ref 3.5–5.1)
Sodium: 135 mmol/L (ref 135–145)
TOTAL PROTEIN: 6.4 g/dL — AB (ref 6.5–8.1)

## 2017-08-04 LAB — URINALYSIS, ROUTINE W REFLEX MICROSCOPIC
Bilirubin Urine: NEGATIVE
GLUCOSE, UA: NEGATIVE mg/dL
Hgb urine dipstick: NEGATIVE
KETONES UR: NEGATIVE mg/dL
LEUKOCYTES UA: NEGATIVE
Nitrite: NEGATIVE
PH: 7 (ref 5.0–8.0)
Protein, ur: NEGATIVE mg/dL
SPECIFIC GRAVITY, URINE: 1.008 (ref 1.005–1.030)

## 2017-08-04 LAB — PROTEIN / CREATININE RATIO, URINE: Creatinine, Urine: 60 mg/dL

## 2017-08-04 LAB — POCT FERN TEST
POCT FERN TEST: POSITIVE
POCT Fern Test: NEGATIVE

## 2017-08-04 LAB — ABO/RH: ABO/RH(D): A POS

## 2017-08-04 LAB — TYPE AND SCREEN
ABO/RH(D): A POS
Antibody Screen: NEGATIVE

## 2017-08-04 MED ORDER — OXYCODONE-ACETAMINOPHEN 5-325 MG PO TABS: 1.0000 | ORAL_TABLET | ORAL | Status: DC | PRN

## 2017-08-04 MED ORDER — OXYTOCIN 40 UNITS IN LACTATED RINGERS INFUSION - SIMPLE MED
2.5000 [IU]/h | INTRAVENOUS | Status: DC
  Filled 2017-08-04 (×2): qty 1000

## 2017-08-04 MED ORDER — PENICILLIN G POT IN DEXTROSE 60000 UNIT/ML IV SOLN
3.0000 10*6.[IU] | INTRAVENOUS | Status: DC
  Administered 2017-08-04 (×3): 3 10*6.[IU] via INTRAVENOUS
  Filled 2017-08-04 (×7): qty 50

## 2017-08-04 MED ORDER — ACETAMINOPHEN 325 MG PO TABS: 650.0000 mg | ORAL_TABLET | ORAL | Status: DC | PRN

## 2017-08-04 MED ORDER — OXYTOCIN 40 UNITS IN LACTATED RINGERS INFUSION - SIMPLE MED
1.0000 m[IU]/min | INTRAVENOUS | Status: DC
  Administered 2017-08-04: 2 m[IU]/min via INTRAVENOUS

## 2017-08-04 MED ORDER — FENTANYL 2.5 MCG/ML BUPIVACAINE 1/10 % EPIDURAL INFUSION (WH - ANES)
INTRAMUSCULAR | Status: AC
  Filled 2017-08-04: qty 100

## 2017-08-04 MED ORDER — LIDOCAINE HCL (PF) 1 % IJ SOLN
INTRAMUSCULAR | Status: DC | PRN
  Administered 2017-08-04 (×2): 4 mL

## 2017-08-04 MED ORDER — SOD CITRATE-CITRIC ACID 500-334 MG/5ML PO SOLN: 30.0000 mL | ORAL | Status: DC | PRN

## 2017-08-04 MED ORDER — MISOPROSTOL 25 MCG QUARTER TABLET
25.0000 ug | ORAL_TABLET | ORAL | Status: DC | PRN
  Administered 2017-08-04: 25 ug via BUCCAL
  Filled 2017-08-04: qty 1

## 2017-08-04 MED ORDER — LIDOCAINE HCL (PF) 1 % IJ SOLN
30.0000 mL | INTRAMUSCULAR | Status: DC | PRN
  Filled 2017-08-04: qty 30

## 2017-08-04 MED ORDER — EPHEDRINE 5 MG/ML INJ
10.0000 mg | INTRAVENOUS | Status: DC | PRN
  Filled 2017-08-04: qty 2

## 2017-08-04 MED ORDER — PHENYLEPHRINE 40 MCG/ML (10ML) SYRINGE FOR IV PUSH (FOR BLOOD PRESSURE SUPPORT)
80.0000 ug | PREFILLED_SYRINGE | INTRAVENOUS | Status: DC | PRN
  Filled 2017-08-04: qty 5

## 2017-08-04 MED ORDER — PHENYLEPHRINE 40 MCG/ML (10ML) SYRINGE FOR IV PUSH (FOR BLOOD PRESSURE SUPPORT)
PREFILLED_SYRINGE | INTRAVENOUS | Status: AC
  Filled 2017-08-04: qty 20

## 2017-08-04 MED ORDER — TERBUTALINE SULFATE 1 MG/ML IJ SOLN
0.2500 mg | Freq: Once | INTRAMUSCULAR | Status: DC | PRN
  Filled 2017-08-04: qty 1

## 2017-08-04 MED ORDER — OXYCODONE-ACETAMINOPHEN 5-325 MG PO TABS: 2.0000 | ORAL_TABLET | ORAL | Status: DC | PRN

## 2017-08-04 MED ORDER — LACTATED RINGERS IV SOLN
INTRAVENOUS | Status: DC
  Administered 2017-08-04 (×2): via INTRAVENOUS

## 2017-08-04 MED ORDER — SODIUM CHLORIDE 0.9 % IV SOLN
5.0000 10*6.[IU] | Freq: Once | INTRAVENOUS | Status: AC
  Administered 2017-08-04: 5 10*6.[IU] via INTRAVENOUS
  Filled 2017-08-04: qty 5

## 2017-08-04 MED ORDER — ONDANSETRON HCL 4 MG/2ML IJ SOLN: 4.0000 mg | Freq: Four times a day (QID) | INTRAMUSCULAR | Status: DC | PRN

## 2017-08-04 MED ORDER — LACTATED RINGERS IV BOLUS (SEPSIS)
1000.0000 mL | Freq: Once | INTRAVENOUS | Status: AC
  Administered 2017-08-04: 1000 mL via INTRAVENOUS

## 2017-08-04 MED ORDER — FLEET ENEMA 7-19 GM/118ML RE ENEM: 1.0000 | ENEMA | RECTAL | Status: DC | PRN

## 2017-08-04 MED ORDER — LACTATED RINGERS IV SOLN: 500.0000 mL | INTRAVENOUS | Status: DC | PRN

## 2017-08-04 MED ORDER — FENTANYL 2.5 MCG/ML BUPIVACAINE 1/10 % EPIDURAL INFUSION (WH - ANES): 14.0000 mL/h | INTRAMUSCULAR | Status: DC | PRN

## 2017-08-04 MED ORDER — LACTATED RINGERS IV SOLN: 500.0000 mL | Freq: Once | INTRAVENOUS | Status: DC

## 2017-08-04 MED ORDER — MISOPROSTOL 50MCG HALF TABLET
50.0000 ug | ORAL_TABLET | ORAL | Status: DC | PRN
  Administered 2017-08-04: 50 ug via BUCCAL
  Filled 2017-08-04 (×2): qty 1

## 2017-08-04 MED ORDER — DIPHENHYDRAMINE HCL 50 MG/ML IJ SOLN: 12.5000 mg | INTRAMUSCULAR | Status: DC | PRN

## 2017-08-04 MED ORDER — FENTANYL 2.5 MCG/ML BUPIVACAINE 1/10 % EPIDURAL INFUSION (WH - ANES)
14.0000 mL/h | INTRAMUSCULAR | Status: DC | PRN
  Administered 2017-08-04: 14 mL/h via EPIDURAL

## 2017-08-04 MED ORDER — OXYTOCIN BOLUS FROM INFUSION
500.0000 mL | Freq: Once | INTRAVENOUS | Status: AC
  Administered 2017-08-05: 500 mL via INTRAVENOUS

## 2017-08-04 NOTE — Progress Notes (Signed)
Labor Progress Note Stacie Harding is a 22 y.o. G1P0 at 925w5d presented for PROM  S: Patient comfortable with epidural. Few isolated elevated BP, asymptomatic. About 25 cc output recorded once Foley cath placed ~1800, UOP has increased (~15400ml) once 1L bolus started.  O:  BP 129/79   Pulse 82   Temp 97.9 F (36.6 C) (Axillary)   Resp 18   Ht 5\' 3"  (1.6 m)   Wt 161 lb (73 kg)   LMP 10/30/2016 (Exact Date)   SpO2 100%   BMI 28.52 kg/m   ZOX:[id number]:baseline rate 120, moderate variability, + acels, no decels Toco: ctx every 2 min  CVE: Dilation: 4.5 Effacement (%): 90 Cervical Position: Middle Station: -1 Presentation: Vertex Exam by:: Irving BurtonEmily Rothermel RN   A&P: 22 y.o. G1P0 5525w5d here for PROM. #Labor: Progressing well on Pitocin, continue to titrate as needed.  #Pain: Epidural #FWB: Cat I #GBS positive, PCN #Elevated BP: intermittent, not in severe range, asymptomatic. Continue to monitor  Ellwood DenseAlison Rumball, DO 10:22 PM

## 2017-08-04 NOTE — Progress Notes (Signed)
Cleone Slimaroline Neill CNM notified of pt's admission and status. Aware fern slide negative with pt stating leaking cl fld since THurs. Pm. CNM will do spec exam

## 2017-08-04 NOTE — Progress Notes (Signed)
Spec exam done by Care Provider to R/O rupture

## 2017-08-04 NOTE — Progress Notes (Addendum)
[id number]: Provider at bedside. Vaginal exam

## 2017-08-04 NOTE — MAU Note (Addendum)
Leaking fld since THursday night off and on. Fld is clear. No pain but does feel tightening of ctxs

## 2017-08-04 NOTE — MAU Provider Note (Signed)
S: Ms. Stacie Harding is a 22 y.o. G1P0 at 3768w5d  who presents to MAU today complaining of leaking of fluid since Thursday night. She denies vaginal bleeding. She endorses contractions. She reports normal fetal movement.    O: BP 129/83 (BP Location: Right Arm)   Pulse 80   Temp 97.9 F (36.6 C) (Oral)   Resp 16   Ht 5\' 3"  (1.6 m)   Wt 161 lb (73 kg)   LMP 10/30/2016 (Exact Date)   SpO2 100%   BMI 28.52 kg/m  GENERAL: Well-developed, well-nourished female in no acute distress.  HEAD: Normocephalic, atraumatic.  CHEST: Normal effort of breathing, regular heart rate ABDOMEN: Soft, nontender, gravid PELVIC: Normal external female genitalia. Vagina is pink and rugated. Cervix with normal contour, no lesions. Normal discharge.  No pooling.   Cervical exam:  Dilation: 2 Effacement (%): 50 Station: -2 Presentation: Vertex Exam by:: Ma Hillock. Michelene Keniston CNM   Fetal Monitoring: Baseline: 130 Variability: moderate Accelerations: 15x15 Decelerations: None Contractions: 4-5  Fern Test: Positive  A: SIUP at 4268w5d  SROM  P: RN to call report to labor team.  Admit to birthing suites  Rolm Bookbindereill, Glover Capano M, [place] 08/04/2017 6:36 AM

## 2017-08-04 NOTE — Progress Notes (Signed)
Ivonne AndrewV. SMith CNM notified of pt's admission and status. Aware of leaking cl fld since THurs PM with pos fern this am. Aware of sve, ctx pattern, gbs pos. Admission orders received

## 2017-08-04 NOTE — Anesthesia Pain Management Evaluation Note (Signed)
  CRNA Pain Management Visit Note  Patient: Stacie Harding, 22 y.o., female  "Hello [facility] am a [facility]. We have an anesthesia team available at all times to provide care throughout the hospital, including epidural management and anesthesia for C-section. I don't know your plan for the delivery whether it a natural birth, water birth, IV sedation, nitrous supplementation, doula or epidural, but we want to meet your pain goals."   1.Was your pain managed to your expectations on prior hospitalizations?   No prior hospitalizations  2.What is your expectation for pain management during this hospitalization?     Epidural  3.How can we help you reach that goal? Epidural  Record the patient's initial score and the patient's pain goal.   Pain: 0  Pain Goal: 6 [facility] wants you to be able to say your pain was always managed very well.  Rica RecordsICKELTON,Christyanna Mckeon 08/04/2017

## 2017-08-04 NOTE — Progress Notes (Addendum)
Stacie Harding is a 22 y.o. G1P0 at 489w5d by LMP admitted for rupture of membranes  Subjective:   Pt reports mild contractions.   Objective: BP (!) 129/96   Pulse (!) 102   Temp 98.3 F (36.8 C) (Oral)   Resp 18   Ht 5\' 3"  (1.6 m)   Wt 73 kg (161 lb)   LMP 10/30/2016 (Exact Date)   SpO2 100%   BMI 28.52 kg/m  No intake/output data recorded. No intake/output data recorded.  FHT:  FHR: 135 bpm, variability: moderate,  accelerations:  Present,  decelerations:  Absent UC:   irregular, every 2-6 minutes SVE:   Dilation: 2 Effacement (%): 50 Station: -3 Exam by:: C. Goodman,RN  Labs: Lab Results  Component Value Date   WBC 4.7 08/04/2017   HGB 10.0 (L) 08/04/2017   HCT 29.3 (L) 08/04/2017   MCV 85.2 08/04/2017   PLT 188 08/04/2017    Assessment / Plan:  G1P0 @ 599w5d  1. Labor: PROM, early labor  2. Fetal wellbeing:Category 1 3. Pain control: analgesia/anestheia PRN 4. GBS: pos-PCN 5. Elevated bp  Anticipate NSVD   Stacie Harding 08/04/2017, 1:27 PM  I assessed this pt and agree with above assessment

## 2017-08-04 NOTE — Anesthesia Preprocedure Evaluation (Signed)
Anesthesia Evaluation  Patient identified by MRN, date of birth, ID band Patient awake    Reviewed: Allergy & Precautions, NPO status , Patient's Chart, lab work & pertinent test results  Airway Mallampati: II  TM Distance: >3 FB Neck ROM: Full    Dental no notable dental hx.    Pulmonary neg pulmonary ROS, Current Smoker,    Pulmonary exam normal breath sounds clear to auscultation       Cardiovascular negative cardio ROS Normal cardiovascular exam Rhythm:Regular Rate:Normal     Neuro/Psych negative neurological ROS  negative psych ROS   GI/Hepatic negative GI ROS, Neg liver ROS,   Endo/Other  negative endocrine ROS  Renal/GU negative Renal ROS     Musculoskeletal negative musculoskeletal ROS (+)   Abdominal   Peds  Hematology negative hematology ROS (+)   Anesthesia Other Findings   Reproductive/Obstetrics (+) Pregnancy                             Anesthesia Physical Anesthesia Plan  ASA: II  Anesthesia Plan: Epidural   Post-op Pain Management:    Induction:   PONV Risk Score and Plan:   Airway Management Planned:   Additional Equipment:   Intra-op Plan:   Post-operative Plan:   Informed Consent: I have reviewed the patients History and Physical, chart, labs and discussed the procedure including the risks, benefits and alternatives for the proposed anesthesia with the patient or authorized representative who has indicated his/her understanding and acceptance.     Plan Discussed with:   Anesthesia Plan Comments:         Anesthesia Quick Evaluation  

## 2017-08-04 NOTE — Progress Notes (Addendum)
Stacie Harding is a 22 y.o. G1P0 at 6737w5d by LMP admitted for PROM.  Subjective:  Pt states contractions are more painful. Desires epidural.  Objective:   BP 132/83   Pulse 86   Temp 98.3 F (36.8 C) (Oral)   Resp 16   Ht 5\' 3"  (1.6 m)   Wt 73 kg (161 lb)   LMP 10/30/2016 (Exact Date)   SpO2 100%   BMI 28.52 kg/m  No intake/output data recorded. No intake/output data recorded.   FHT:  FHR: 145 bpm, variability: moderate,  accelerations:  Present,  decelerations:  Absent UC:   regular, every 1-3 minutes SVE:   Dilation: 2.5 Effacement (%): 80 Station: -2 Exam by:: Stacie Harding, student CNM  Labs: Lab Results  Component Value Date   WBC 4.7 08/04/2017   HGB 10.0 (L) 08/04/2017   HCT 29.3 (L) 08/04/2017   MCV 85.2 08/04/2017   PLT 188 08/04/2017    Assessment / Plan: G1P0 @ 4637w5d  PROM, early labor  Labor: forebag ruptured, IUPC placed. Will start pitocin now. Preeclampsia:  n/a Fetal Wellbeing:  Category I Pain Control:  analgesia/anesthesia PRN I/D:  GBS +, PCN Anticipated MOD:  NSVD  Stacie Harding 08/04/2017, 5:25 PM I was present for assessment and agree with above assessment

## 2017-08-04 NOTE — Anesthesia Procedure Notes (Addendum)
Epidural Patient location during procedure: OB Start time: 08/04/2017 5:48 PM End time: 08/04/2017 5:59 PM  Staffing Anesthesiologist: Lewie LoronGermeroth, Kerby Borner, MD Performed: anesthesiologist   Preanesthetic Checklist Completed: patient identified, pre-op evaluation, timeout performed, IV checked, risks and benefits discussed and monitors and equipment checked  Epidural Patient position: sitting Prep: site prepped and draped and DuraPrep Patient monitoring: heart rate, continuous pulse ox and blood pressure Approach: midline Location: L3-L4 Injection technique: LOR air and LOR saline  Needle:  Needle type: Tuohy  Needle gauge: 17 G Needle length: 9 cm Needle insertion depth: 6 cm Catheter type: closed end flexible Catheter size: 19 Gauge Catheter at skin depth: 11 cm Test dose: negative  Assessment Sensory level: T8 Events: blood not aspirated, injection not painful, no injection resistance, negative IV test and no paresthesia  Additional Notes Reason for block:procedure for pain

## 2017-08-04 NOTE — H&P (Signed)
Stacie Harding is a 22 y.o. female presenting for leaking clear fluid and mild contractions. First noticed small amount of leaking the night of 2/28, but started leaking more this morning. Fern and pool pos in MAU.   [facility] Prenatal Labs  Dating LMP c/w 8wk [place] Blood type: A/Positive/-- (07/26 0948)   Genetic Screen  Quad: Declined Antibody:Negative (07/26 0948)  Anatomic [place] normal Rubella: 10.60 (07/26 0948)  GTT Early:               Third trimester: nl 2 hour RPR: Non Reactive (12/14 0921)   Flu vaccine  declined; s/p counseling HBsAg: Negative (07/26 0948)   TDaP vaccine  declined; s/p counseling               HIV: Non Reactive (12/14 0921)   Baby Food   breast                                            GBS: (For PCN allergy, check sensitivities)  Contraception  nexplanon Pap:  Circumcision  yes- OP    Pediatrician undecided CF:  Support Person  FOB: [facility]  Prenatal Classes  Hgb electrophoresis:   OB History    Gravida Para Term Preterm AB Living   1             SAB TAB Ectopic Multiple Live Births                 History reviewed. No pertinent past medical history. Past Surgical History:  Procedure Laterality Date  . KNEE SURGERY     Family History: family history includes Diabetes in her maternal grandmother; Hypertension in her maternal grandmother. Social History:  reports that she has been smoking.  she has never used smokeless tobacco. She reports that she does not drink alcohol or use drugs.     Maternal Diabetes: No Genetic Screening: Declined Maternal Ultrasounds/Referrals: Normal Fetal Ultrasounds or other Referrals:  None Maternal Substance Abuse:  No Significant Maternal Medications:  None Significant Maternal Lab Results:  Lab values include: Group B Strep positive Other Comments:  Fragile X and SMA neg. ?Prolonged ROM  Review of Systems  Eyes: Negative for blurred vision.  Cardiovascular: Negative for leg swelling.  Neurological:  Negative for headaches.   Maternal Medical History:  Reason for admission: Rupture of membranes and contractions.   Contractions: Frequency: regular.   Perceived severity is mild.    Fetal activity: Perceived fetal activity is normal.   Last perceived fetal movement was within the past hour.    Prenatal Complications - Diabetes: none.    Dilation: 2 Effacement (%): 50 Station: -2 Exam by:: C. Neill CNM Blood pressure 129/83, pulse 80, temperature 97.9 F (36.6 C), temperature source Oral, resp. rate 16, height 5\' 3"  (1.6 m), weight 161 lb (73 kg), last menstrual period 10/30/2016, SpO2 100 %.  Patient Vitals for the past 24 hrs:  BP Temp Temp src Pulse Resp SpO2 Height Weight  08/04/17 0724 137/90 - - 81 18 - - -  08/04/17 0710 (!) 143/93 98.3 F (36.8 C) Oral 77 18 - 5\' 3"  (1.6 m) 161 lb (73 kg)  08/04/17 0631 129/83 - - 80 16 100 % - -  08/04/17 0615 129/80 - - 73 18 99 % - -  08/04/17 0602 122/80 - - 74 16 100 % - -  08/04/17 0601 126/79 - - 71 14 - - -  08/04/17 0559 131/85 97.9 F (36.6 C) Oral 69 16 100 % - -  08/04/17 0539 - - - - - - 5\' 3"  (1.6 m) 161 lb (73 kg)    Maternal Exam:  Uterine Assessment: Contraction strength is mild.  Contraction frequency is regular.   Abdomen: Patient reports no abdominal tenderness. Estimated fetal weight is 7lb 8oz.   Fetal presentation: vertex  Introitus: Normal vulva. Normal vagina.  Ferning test: positive.  Amniotic fluid character: clear.  Pelvis: adequate for delivery.   Cervix: Cervix evaluated by sterile speculum exam and digital exam.     Fetal Exam Fetal Monitor Review: Mode: ultrasound.   Baseline rate: 130.  Variability: moderate (6-25 bpm).   Pattern: accelerations present and no decelerations.    Fetal State Assessment: Category I - tracings are normal.     Physical Exam  Nursing note and vitals reviewed. Constitutional: She is oriented to person, place, and time. She appears well-developed and  well-nourished. No distress.  Eyes: No scleral icterus.  Cardiovascular: Normal rate, regular rhythm and normal heart sounds.  Respiratory: Effort normal and breath sounds normal. No respiratory distress.  GI: Soft. There is no tenderness.  Genitourinary: Vagina normal.  Genitourinary Comments: Pos pooling per Cleone Slimaroline Neill CNM  Musculoskeletal: She exhibits no edema or tenderness.  Neurological: She is alert and oriented to person, place, and time. She has normal reflexes.  Skin: Skin is warm and dry.  Psychiatric: She has a normal mood and affect.    Prenatal labs: ABO, Rh: A/Positive/-- (07/26 16100948) Antibody: Negative (07/26 0948) Rubella: 10.60 (07/26 0948) RPR: Non Reactive (12/14 0921)  HBsAg: Negative (07/26 0948)  HIV: Non Reactive (12/14 0921)  GBS:   Pos urine  Assessment: 1. Labor: Early, PROM, possibly prolonged 2. Fetal Wellbeing: Category I  3. Pain Control: None 4. GBS: Pos 5. 39.5 week IUP 6. Elevated BPs w/out Pre-E Sx  Plan:  1. Admit to BS per consult with MD 2. Routine L&D orders 3. Analgesia/anesthesia PRN  4. PCN 5. Cytotec  6. Pre-E labs  [place] Srijan Givan 08/04/2017, 7:19 AM

## 2017-08-05 ENCOUNTER — Encounter (HOSPITAL_COMMUNITY): Payer: Self-pay

## 2017-08-05 DIAGNOSIS — O99824 Streptococcus B carrier state complicating childbirth: Secondary | ICD-10-CM

## 2017-08-05 DIAGNOSIS — Z3A39 39 weeks gestation of pregnancy: Secondary | ICD-10-CM

## 2017-08-05 LAB — CBC
HCT: 29.7 % — ABNORMAL LOW (ref 36.0–46.0)
Hemoglobin: 9.9 g/dL — ABNORMAL LOW (ref 12.0–15.0)
MCH: 28.6 pg (ref 26.0–34.0)
MCHC: 33.3 g/dL (ref 30.0–36.0)
MCV: 85.8 fL (ref 78.0–100.0)
Platelets: 189 10*3/uL (ref 150–400)
RBC: 3.46 MIL/uL — ABNORMAL LOW (ref 3.87–5.11)
RDW: 12.8 % (ref 11.5–15.5)
WBC: 8.6 10*3/uL (ref 4.0–10.5)

## 2017-08-05 LAB — RPR: RPR Ser Ql: NONREACTIVE

## 2017-08-05 MED ORDER — ZOLPIDEM TARTRATE 5 MG PO TABS: 5.0000 mg | ORAL_TABLET | Freq: Every evening | ORAL | Status: DC | PRN

## 2017-08-05 MED ORDER — BENZOCAINE-MENTHOL 20-0.5 % EX AERO
1.0000 "application " | INHALATION_SPRAY | CUTANEOUS | Status: DC | PRN
  Filled 2017-08-05: qty 56

## 2017-08-05 MED ORDER — SIMETHICONE 80 MG PO CHEW: 80.0000 mg | CHEWABLE_TABLET | ORAL | Status: DC | PRN

## 2017-08-05 MED ORDER — ACETAMINOPHEN 325 MG PO TABS: 650.0000 mg | ORAL_TABLET | ORAL | Status: DC | PRN

## 2017-08-05 MED ORDER — DIBUCAINE 1 % RE OINT: 1.0000 "application " | TOPICAL_OINTMENT | RECTAL | Status: DC | PRN

## 2017-08-05 MED ORDER — IBUPROFEN 600 MG PO TABS
600.0000 mg | ORAL_TABLET | Freq: Four times a day (QID) | ORAL | Status: DC
  Administered 2017-08-05 – 2017-08-06 (×6): 600 mg via ORAL
  Filled 2017-08-05 (×6): qty 1

## 2017-08-05 MED ORDER — SENNOSIDES-DOCUSATE SODIUM 8.6-50 MG PO TABS
2.0000 | ORAL_TABLET | ORAL | Status: DC
  Administered 2017-08-05: 2 via ORAL
  Filled 2017-08-05: qty 2

## 2017-08-05 MED ORDER — TETANUS-DIPHTH-ACELL PERTUSSIS 5-2.5-18.5 LF-MCG/0.5 IM SUSP: 0.5000 mL | Freq: Once | INTRAMUSCULAR | Status: DC

## 2017-08-05 MED ORDER — DIPHENHYDRAMINE HCL 25 MG PO CAPS: 25.0000 mg | ORAL_CAPSULE | Freq: Four times a day (QID) | ORAL | Status: DC | PRN

## 2017-08-05 MED ORDER — WITCH HAZEL-GLYCERIN EX PADS: 1.0000 "application " | MEDICATED_PAD | CUTANEOUS | Status: DC | PRN

## 2017-08-05 MED ORDER — PRENATAL MULTIVITAMIN CH
1.0000 | ORAL_TABLET | Freq: Every day | ORAL | Status: DC
  Administered 2017-08-05 – 2017-08-06 (×2): 1 via ORAL
  Filled 2017-08-05 (×2): qty 1

## 2017-08-05 MED ORDER — ONDANSETRON HCL 4 MG/2ML IJ SOLN: 4.0000 mg | INTRAMUSCULAR | Status: DC | PRN

## 2017-08-05 MED ORDER — ONDANSETRON HCL 4 MG PO TABS: 4.0000 mg | ORAL_TABLET | ORAL | Status: DC | PRN

## 2017-08-05 MED ORDER — COCONUT OIL OIL
1.0000 "application " | TOPICAL_OIL | Status: DC | PRN
  Administered 2017-08-06: 1 via TOPICAL
  Filled 2017-08-05: qty 120

## 2017-08-05 NOTE — Anesthesia Postprocedure Evaluation (Signed)
Anesthesia Post Note  Patient: Stacie Harding  Procedure(s) Performed: AN AD HOC LABOR EPIDURAL     Patient location during evaluation: Mother Baby Anesthesia Type: Epidural Level of consciousness: awake and alert and oriented Pain management: satisfactory to patient Vital Signs Assessment: post-procedure vital signs reviewed and stable Respiratory status: spontaneous breathing and nonlabored ventilation Cardiovascular status: stable Postop Assessment: no headache, no backache, no signs of nausea or vomiting, adequate PO intake and patient able to bend at knees (patient up walking) Anesthetic complications: no    Last Vitals:  Vitals:   08/05/17 0423 08/05/17 0850  BP: 125/72 (!) 119/54  Pulse: 80 84  Resp: 18 18  Temp: 37.1 C 37.2 C  SpO2:      Last Pain:  Vitals:   08/05/17 0850  TempSrc: Oral  PainSc:    Pain Goal: Patients Stated Pain Goal: 8 (08/04/17 1503)               Madison HickmanGREGORY,Cherlyn Syring

## 2017-08-06 MED ORDER — IBUPROFEN 600 MG PO TABS: 600.0000 mg | ORAL_TABLET | Freq: Four times a day (QID) | ORAL | 0 refills | Status: DC

## 2017-08-06 NOTE — Discharge Instructions (Signed)
Vaginal Delivery, Care After °Refer to this sheet in the next few weeks. These instructions provide you with information about caring for yourself after vaginal delivery. Your health care provider may also give you more specific instructions. Your treatment has been planned according to current medical practices, but problems sometimes occur. Call your health care provider if you have any problems or questions. °What can I expect after the procedure? °After vaginal delivery, it is common to have: °· Some bleeding from your vagina. °· Soreness in your abdomen, your vagina, and the area of skin between your vaginal opening and your anus (perineum). °· Pelvic cramps. °· Fatigue. ° °Follow these instructions at home: °Medicines °· Take over-the-counter and prescription medicines only as told by your health care provider. °· If you were prescribed an antibiotic medicine, take it as told by your health care provider. Do not stop taking the antibiotic until it is finished. °Driving ° °· Do not drive or operate heavy machinery while taking prescription pain medicine. °· Do not drive for 24 hours if you received a sedative. °Lifestyle °· Do not drink alcohol. This is especially important if you are breastfeeding or taking medicine to relieve pain. °· Do not use tobacco products, including cigarettes, chewing tobacco, or e-cigarettes. If you need help quitting, ask your health care provider. °Eating and drinking °· Drink at least 8 eight-ounce glasses of water every day unless you are told not to by your health care provider. If you choose to breastfeed your baby, you may need to drink more water than this. °· Eat high-fiber foods every day. These foods may help prevent or relieve constipation. High-fiber foods include: °? Whole grain cereals and breads. °? Brown rice. °? Beans. °? Fresh fruits and vegetables. °Activity °· Return to your normal activities as told by your health care provider. Ask your health care provider  what activities are safe for you. °· Rest as much as possible. Try to rest or take a nap when your baby is sleeping. °· Do not lift anything that is heavier than your baby or 10 lb (4.5 kg) until your health care provider says that it is safe. °· Talk with your health care provider about when you can engage in sexual activity. This may depend on your: °? Risk of infection. °? Rate of healing. °? Comfort and desire to engage in sexual activity. °Vaginal Care °· If you have an episiotomy or a vaginal tear, check the area every day for signs of infection. Check for: °? More redness, swelling, or pain. °? More fluid or blood. °? Warmth. °? Pus or a bad smell. °· Do not use tampons or douches until your health care provider says this is safe. °· Watch for any blood clots that may pass from your vagina. These may look like clumps of dark red, brown, or black discharge. °General instructions °· Keep your perineum clean and dry as told by your health care provider. °· Wear loose, comfortable clothing. °· Wipe from front to back when you use the toilet. °· Ask your health care provider if you can shower or take a bath. If you had an episiotomy or a perineal tear during labor and delivery, your health care provider may tell you not to take baths for a certain length of time. °· Wear a bra that supports your breasts and fits you well. °· If possible, have someone help you with household activities and help care for your baby for at least a few days after   you leave the hospital. °· Keep all follow-up visits for you and your baby as told by your health care provider. This is important. °Contact a health care provider if: °· You have: °? Vaginal discharge that has a bad smell. °? Difficulty urinating. °? Pain when urinating. °? A sudden increase or decrease in the frequency of your bowel movements. °? More redness, swelling, or pain around your episiotomy or vaginal tear. °? More fluid or blood coming from your episiotomy or  vaginal tear. °? Pus or a bad smell coming from your episiotomy or vaginal tear. °? A fever. °? A rash. °? Little or no interest in activities you used to enjoy. °? Questions about caring for yourself or your baby. °· Your episiotomy or vaginal tear feels warm to the touch. °· Your episiotomy or vaginal tear is separating or does not appear to be healing. °· Your breasts are painful, hard, or turn red. °· You feel unusually sad or worried. °· You feel nauseous or you vomit. °· You pass large blood clots from your vagina. If you pass a blood clot from your vagina, save it to show to your health care provider. Do not flush blood clots down the toilet without having your health care provider look at them. °· You urinate more than usual. °· You are dizzy or light-headed. °· You have not breastfed at all and you have not had a menstrual period for 12 weeks after delivery. °· You have stopped breastfeeding and you have not had a menstrual period for 12 weeks after you stopped breastfeeding. °Get help right away if: °· You have: °? Pain that does not go away or does not get better with medicine. °? Chest pain. °? Difficulty breathing. °? Blurred vision or spots in your vision. °? Thoughts about hurting yourself or your baby. °· You develop pain in your abdomen or in one of your legs. °· You develop a severe headache. °· You faint. °· You bleed from your vagina so much that you fill two sanitary pads in one hour. °This information is not intended to replace advice given to you by your health care provider. Make sure you discuss any questions you have with your health care provider. °Document Released: 05/18/2000 Document Revised: 11/02/2015 Document Reviewed: 06/05/2015 °Elsevier Interactive Patient Education © 2018 [employer] ° °

## 2017-08-06 NOTE — Discharge Summary (Signed)
OB Discharge Summary     Patient Name: Stacie Harding DOB: 04-Aug-1995 MRN: [id number]  Date of admission: 08/04/2017 Delivering MD: Frederik PearEGELE, JULIE P   Date of discharge: 08/06/2017  Admitting diagnosis: 39 wks leaking fluid and having ctx Intrauterine pregnancy: 7911w6d     Secondary diagnosis:  Principal Problem:   SVD (spontaneous vaginal delivery) Active Problems:   Supervision of normal first pregnancy, antepartum   PROM with onset of labor more than 24 hours following rupture  Additional problems:     Discharge diagnosis: Term Pregnancy Delivered                                                                                                Post partum procedures:n/a  Augmentation: Pitocin and Cytotec  Complications: None  Hospital course:  Induction of Labor With Vaginal Delivery   22 y.o. yo G1P1001 at 5311w6d was admitted to the hospital 08/04/2017 for induction of labor.  Indication for induction: PROM.  Patient had an uncomplicated labor course as follows: Membrane Rupture Time/Date: 9:45 PM ,08/02/2017   Intrapartum Procedures: Episiotomy: None [1]                                         Lacerations:  None [1]  Patient had delivery of a Viable infant.  Information for the patient's newborn:  Tamela GammonKasey, Boy Lateefah [[id number]][[id number]]  Delivery Method: Vag-Spont   08/05/2017  Details of delivery can be found in separate delivery note.  Patient had a routine postpartum course. Patient is discharged home 08/06/17.  Physical exam  Vitals:   08/05/17 0423 08/05/17 0850 08/05/17 1750 08/06/17 0517  BP: 125/72 (!) 119/54 116/75 126/74  Pulse: 80 84 75 78  Resp: 18 18 18 18   Temp: 98.8 F (37.1 C) 98.9 F (37.2 C)  98.5 F (36.9 C)  TempSrc: Oral Oral Oral Oral  SpO2:      Weight:      Height:       General: alert, cooperative and no distress Lochia: appropriate Uterine Fundus: firm Incision: N/A DVT Evaluation: No evidence of DVT seen on physical exam. Labs: Lab Results   Component Value Date   WBC 8.6 08/05/2017   HGB 9.9 (L) 08/05/2017   HCT 29.7 (L) 08/05/2017   MCV 85.8 08/05/2017   PLT 189 08/05/2017   CMP Latest Ref Rng & Units 08/04/2017  Glucose 65 - 99 mg/dL 81  BUN 6 - 20 mg/dL 8  Creatinine 7.820.44 - 9.561.00 mg/dL 2.130.83  Sodium 086135 - 578145 mmol/L 135  Potassium 3.5 - 5.1 mmol/L 3.5  Chloride 101 - 111 mmol/L 105  CO2 22 - 32 mmol/L 21(L)  Calcium 8.9 - 10.3 mg/dL 4.6(N8.4(L)  Total Protein 6.5 - 8.1 g/dL 6.4(L)  Total Bilirubin 0.3 - 1.2 mg/dL 0.6  Alkaline Phos 38 - 126 U/L 532(H)  AST 15 - 41 U/L 25  ALT 14 - 54 U/L 11(L)    Discharge instruction: per After Visit Summary and "Baby and Me Booklet".  After visit meds:  Allergies as of 08/06/2017   No Known Allergies     Medication List    TAKE these medications   ibuprofen 600 MG tablet Commonly known as:  ADVIL,MOTRIN Take 1 tablet (600 mg total) by mouth every 6 (six) hours.       Diet: routine diet  Activity: Advance as tolerated. Pelvic rest for 6 weeks.   Outpatient follow up:4 weeks Follow up Appt:No future appointments. Follow up Visit:No Follow-up on file.  Postpartum contraception: Nexplanon  Newborn Data: Live born female  Birth Weight: 8 lb 0.9 oz (3655 g) APGAR: 9, 9  Newborn Delivery   Birth date/time:  08/05/2017 00:23:00 Delivery type:  Vaginal, Spontaneous     Baby Feeding: Breast Disposition:home with mother   08/06/2017 Rolm Bookbinder, CNM

## 2017-08-06 NOTE — Lactation Note (Signed)
This note was copied from a baby's chart. Lactation Consultation Note  Patient Name: Boy Eulah Pontrmani Grater ZOXWR'UToday's Date: 08/06/2017 Reason for consult: Initial assessment   P1, Baby 35 hours old.  Mother has abrasion on R nipple and is sore. Provided mother with coconut oil and reviewed hand expression with good flow. Assisted w/ latching baby in football using pillows to bring baby to nipple height. Mom encouraged to feed baby 8-12 times/24 hours and with feeding cues.  Sucks and swallows observed w/ breast compression. Mom made aware of O/P services, breastfeeding support groups, community resources, and our phone # for post-discharge questions.     Maternal Data Has patient been taught Hand Expression?: Yes Does the patient have breastfeeding experience prior to this delivery?: No  Feeding Feeding Type: Breast Fed Length of feed: 15 min  LATCH Score Latch: Grasps breast easily, tongue down, lips flanged, rhythmical sucking.  Audible Swallowing: A few with stimulation  Type of Nipple: Everted at rest and after stimulation  Comfort (Breast/Nipple): Filling, red/small blisters or bruises, mild/mod discomfort  Hold (Positioning): Assistance needed to correctly position infant at breast and maintain latch.  LATCH Score: 7  Interventions Interventions: Breast feeding basics reviewed;Assisted with latch;Hand express;Breast compression;Support pillows;Coconut oil  Lactation Tools Discussed/Used     Consult Status Consult Status: Follow-up Date: 08/07/17 Follow-up type: In-patient    Dahlia ByesBerkelhammer, Ruth [facility] 08/06/2017, 12:30 PM

## 2017-08-07 ENCOUNTER — Ambulatory Visit: Payer: Self-pay

## 2017-08-07 NOTE — Lactation Note (Signed)
This note was copied from a baby's chart. Lactation Consultation Note  Patient Name: Stacie Harding NWGNF'AToday's Date: 08/07/2017 Reason for consult: 1st time breastfeeding;Follow-up assessment;Nipple pain/trauma  Reviewed doc flowsheets. Infant is 9658 hours of age and the number of voids and stools coincide with the weight loss of the infant.  Per mom, she complained of sore nipples and assessment was performed. Breast shells given with instruction along with comfort gels and manual hand pump for sore nipples and engorgement prevention care. Reviewed positioning, engorgement, breast compression and massage, milk storage, voiding and stooling patterns and flexion/relaxation of infant's extremities during and after feeding.  FOB and grandmother present and active participants.  All questions answered.  Informed mom of breastfeeding support group, outpatient services and help line.   Maternal Data Has patient been taught Hand Expression?: Yes Does the patient have breastfeeding experience prior to this delivery?: No  Feeding Feeding Type: Breast Fed Length of feed: 30 min  LATCH Score Latch: Grasps breast easily, tongue down, lips flanged, rhythmical sucking.  Audible Swallowing: Spontaneous and intermittent  Type of Nipple: Everted at rest and after stimulation  Comfort (Breast/Nipple): Soft / non-tender  Hold (Positioning): Assistance needed to correctly position infant at breast and maintain latch.  LATCH Score: 9  Interventions Interventions: Assisted with latch;Breast massage;Skin to skin;Breast feeding basics reviewed;Breast compression;Adjust position;Comfort gels;Support pillows;Hand pump;Position options  Lactation Tools Discussed/Used Tools: Shells;Pump;Comfort gels;Coconut oil Shell Type: Inverted Breast pump type: Manual Pump Review: Setup, frequency, and cleaning;Milk Storage Initiated by:: Idamae LusherMargaret Iorio, LC Date initiated:: 08/07/17   Consult Status Consult Status:  Follow-up Date: 08/08/17 Follow-up type: Out-patient(Barb Carder, LC)    Amarachi Kotz R Charlayne Vultaggio 08/07/2017, 11:17 AM

## 2017-08-08 ENCOUNTER — Encounter: Payer: Self-pay | Admitting: Family Medicine

## 2017-08-08 MED ORDER — HYDROCHLOROTHIAZIDE 25 MG PO TABS: 25.0000 mg | ORAL_TABLET | Freq: Every day | ORAL | 0 refills | Status: DC | PRN

## 2017-08-09 ENCOUNTER — Encounter: Payer: Medicaid Other | Admitting: Family Medicine

## 2017-08-14 ENCOUNTER — Inpatient Hospital Stay (HOSPITAL_COMMUNITY)
Admission: AD | Admit: 2017-08-14 | Discharge: 2017-08-15 | Disposition: A | Discharge status: Home / Self Care | Payer: Medicaid Other | Source: Ambulatory Visit | Attending: Obstetrics and Gynecology | Admitting: Obstetrics and Gynecology

## 2017-08-14 DIAGNOSIS — N390 Urinary tract infection, site not specified: Secondary | ICD-10-CM | POA: Insufficient documentation

## 2017-08-14 DIAGNOSIS — R300 Dysuria: Secondary | ICD-10-CM | POA: Diagnosis not present

## 2017-08-14 DIAGNOSIS — N762 Acute vulvitis: Secondary | ICD-10-CM

## 2017-08-14 DIAGNOSIS — R102 Pelvic and perineal pain: Secondary | ICD-10-CM | POA: Diagnosis present

## 2017-08-14 LAB — URINALYSIS, ROUTINE W REFLEX MICROSCOPIC
Bilirubin Urine: NEGATIVE
GLUCOSE, UA: NEGATIVE mg/dL
Hgb urine dipstick: NEGATIVE
Ketones, ur: NEGATIVE mg/dL
Nitrite: NEGATIVE
PH: 7 (ref 5.0–8.0)
Protein, ur: NEGATIVE mg/dL
Specific Gravity, Urine: 1.017 (ref 1.005–1.030)

## 2017-08-14 MED ORDER — CEPHALEXIN 500 MG PO CAPS: 500.0000 mg | ORAL_CAPSULE | Freq: Four times a day (QID) | ORAL | 0 refills | Status: DC

## 2017-08-14 NOTE — Discharge Instructions (Signed)

## 2017-08-14 NOTE — MAU Provider Note (Signed)
Chief Complaint:  Pelvic Pain   First Provider Initiated Contact with Patient 08/14/17 2329     HPI: Stacie Harding is a [age] y.o. G1P1001 who presents to maternity admissions reporting pressure with urination and irritation of labia. Initially denied burning there, then described it.  Had no lacerations at birth.  Has been using Always pads. . She reports vaginal bleeding, h/a, dizziness, n/v, or fever/chills.    Pelvic Pain  The patient's primary symptoms include genital itching and pelvic pain. The patient's pertinent negatives include no genital lesions or genital odor. This is a new problem. The current episode started in the past 7 days. The problem occurs intermittently. She is not pregnant. Pertinent negatives include no abdominal pain, back pain, chills, constipation, diarrhea, rash or vomiting. The symptoms are aggravated by tactile pressure. She has tried nothing for the symptoms. She is not sexually active.    RN Note: Pt states she had a SVD on 08/05/2017. States she started having a lot of pressure when she tries to pee. Pt denies burning, pain, or frequency with urination. States she did have an epidural. Pt denies fever. States her bleeding has subsided.     Past Medical History: No past medical history on file.  Past obstetric history: OB History  Gravida Para Term Preterm AB Living  1 1 1     1   SAB TAB Ectopic Multiple Live Births        0 1    # Outcome Date GA Lbr Len/2nd Weight Sex Delivery Anes PTL Lv  1 Term 08/05/17 [id number] 06:34 / 00:19 8 lb 0.9 oz (3.655 kg) M Vag-Spont EPI  LIV      Past Surgical History: Past Surgical History:  Procedure Laterality Date  . KNEE SURGERY      Family History: Family History  Problem Relation Age of Onset  . Diabetes Maternal Grandmother   . Hypertension Maternal Grandmother   . Sudden death Neg Hx   . Hyperlipidemia Neg Hx   . Heart attack Neg Hx     Social History: Social History   Tobacco Use  . Smoking status:  Light Tobacco Smoker  . Smokeless tobacco: Never Used  Substance Use Topics  . Alcohol use: No  . Drug use: No    Allergies: No Known Allergies  Meds:  Medications Prior to Admission  Medication Sig Dispense Refill Last Dose  . hydrochlorothiazide (HYDRODIURIL) 25 MG tablet Take 1 tablet (25 mg total) by mouth daily as needed for up to 14 days (swelling). 14 tablet 0   . ibuprofen (ADVIL,MOTRIN) 600 MG tablet Take 1 tablet (600 mg total) by mouth every 6 (six) hours. 30 tablet 0     I have reviewed patient's Past Medical Hx, Surgical Hx, Family Hx, Social Hx, medications and allergies.  ROS:  Review of Systems  Constitutional: Negative for chills.  Gastrointestinal: Negative for abdominal pain, constipation, diarrhea and vomiting.  Genitourinary: Positive for pelvic pain.  Musculoskeletal: Negative for back pain.  Skin: Negative for rash.   Other systems negative     Physical Exam   Patient Vitals for the past 24 hrs:  BP Temp Temp src Pulse Resp SpO2 Height Weight  08/14/17 1942 122/87 98.4 F (36.9 C) Oral 71 16 100 % 5\' 3"  (1.6 m) 137 lb (62.1 kg)   Constitutional: Well-developed, well-nourished female in no acute distress.  Cardiovascular: normal rate and rhythm, no ectopy audible, S1 & S2 heard, no murmur Respiratory: normal effort, no distress. Lungs  CTAB with no wheezes or crackles GI: Abd soft, non-tender.  Nondistended.  No rebound, No guarding.  Bowel Sounds audible  MS: Extremities nontender, no edema, normal ROM Neurologic: Alert and oriented x 4.   Grossly nonfocal. GU: Neg CVAT. Skin:  Warm and Dry Psych:  Affect appropriate.  PELVIC EXAM: Cervix pink, visually closed, without lesion, scant white creamy discharge                           There is erethema of both labia minora.  No discharge. No lesions     Labs: Results for orders placed or performed during the hospital encounter of 08/14/17 (from the past 24 hour(s))  Urinalysis, Routine w reflex  microscopic     Status: Abnormal   Collection Time: 08/14/17  7:44 PM  Result Value Ref Range   Color, Urine YELLOW YELLOW   APPearance CLEAR CLEAR   Specific Gravity, Urine 1.017 1.005 - 1.030   pH 7.0 5.0 - 8.0   Glucose, UA NEGATIVE NEGATIVE mg/dL   Hgb urine dipstick NEGATIVE NEGATIVE   Bilirubin Urine NEGATIVE NEGATIVE   Ketones, ur NEGATIVE NEGATIVE mg/dL   Protein, ur NEGATIVE NEGATIVE mg/dL   Nitrite NEGATIVE NEGATIVE   Leukocytes, UA SMALL (A) NEGATIVE   RBC / HPF 0-5 0 - 5 RBC/hpf   WBC, UA 0-5 0 - 5 WBC/hpf   Bacteria, UA RARE (A) NONE SEEN   Squamous Epithelial / LPF 0-5 (A) NONE SEEN   Mucus PRESENT    --/--/A POS, A POS Performed at [facility], [facility], [address] [place], [place] [address]  [phone])  Imaging:  No results found.  MAU Course/MDM: I have ordered labs as follows:  UA which showed small leukocytes, sent to culture, will treat based on sx Imaging ordered: none Results reviewed. Discussed this labial irritation may be reaction to Always pads. Will Rx Kenalog ointment   Treatments in MAU included Kenalog ointment.   Pt stable at time of discharge.  Assessment: Dysuria - Plan: Discharge patient Perineal inflammation Possible UTI  Plan: Discharge home Recommend conservative care Rx sent for keflex  for UTI Rx sent for Kenalog ointment for irritation   Encouraged to return here or to other Urgent Care/ED if she develops worsening of symptoms, increase in pain, fever, or other concerning symptoms.   Wynelle Bourgeois CNM, MSN Certified Nurse-Midwife 08/14/2017 11:29 PM

## 2017-08-14 NOTE — MAU Note (Addendum)
Pt states she had a SVD on 08/05/2017. States she started having a lot of pressure when she tries to pee. Pt denies burning, pain, or frequency with urination. States she did have an epidural. Pt denies fever. States her bleeding has subsided.

## 2017-08-15 MED ORDER — TRIAMCINOLONE ACETONIDE 0.1 % EX OINT
TOPICAL_OINTMENT | Freq: Once | CUTANEOUS | Status: AC
  Administered 2017-08-15: 01:00:00 via TOPICAL
  Filled 2017-08-15: qty 15

## 2017-08-15 MED ORDER — TRIAMCINOLONE ACETONIDE 0.5 % EX OINT: 1.0000 "application " | TOPICAL_OINTMENT | Freq: Two times a day (BID) | CUTANEOUS | 0 refills | Status: DC

## 2017-08-16 LAB — URINE CULTURE: Culture: NO GROWTH

## 2017-09-09 ENCOUNTER — Encounter: Payer: Self-pay | Admitting: Family Medicine

## 2017-09-09 ENCOUNTER — Ambulatory Visit (INDEPENDENT_AMBULATORY_CARE_PROVIDER_SITE_OTHER): Payer: Medicaid Other | Admitting: Family Medicine

## 2017-09-09 ENCOUNTER — Other Ambulatory Visit (HOSPITAL_COMMUNITY)
Admission: RE | Admit: 2017-09-09 | Discharge: 2017-09-09 | Disposition: A | Discharge status: Home / Self Care | Payer: Medicaid Other | Source: Ambulatory Visit | Attending: Family Medicine | Admitting: Family Medicine

## 2017-09-09 DIAGNOSIS — N870 Mild cervical dysplasia: Secondary | ICD-10-CM | POA: Diagnosis not present

## 2017-09-09 DIAGNOSIS — Z1389 Encounter for screening for other disorder: Secondary | ICD-10-CM

## 2017-09-09 DIAGNOSIS — Z3049 Encounter for surveillance of other contraceptives: Secondary | ICD-10-CM

## 2017-09-09 NOTE — Progress Notes (Signed)
Post Partum Exam  Stacie Harding is a 22 y.o. 721P1001 female who presents for a postpartum visit. She is 5 weeks postpartum following a spontaneous vaginal delivery. I have fully reviewed the prenatal and intrapartum course. The delivery was at 39.6 gestational weeks.  Anesthesia: epidural. Postpartum course has been uneventful. Baby's course has been uneventful. Baby is feeding by breast. Bleeding thin lochia. Bowel function is normal. Bladder function is normal. Patient is not sexually active. Contraception method desires Nexplanon. Postpartum depression screening:neg (score:7)   Last pap smear- never had one since she turned 21 during pregnancy.  Review of Systems Pertinent items are noted in HPI.    Objective:  currently breastfeeding.  General:  alert, cooperative and no distress  Lungs: clear to auscultation bilaterally  Heart:  regular rate and rhythm, S1, S2 normal, no murmur, click, rub or gallop  Abdomen: soft, non-tender; bowel sounds normal; no masses,  no organomegaly   Vulva:  normal  Vagina: normal vagina, no discharge, exudate, lesion, or erythema  Cervix:  multiparous appearance and no lesions        Nexplanon Insertion:  Patient given informed consent, signed copy in the chart, time out was performed.   Appropriate time out taken.  Patient's RIGHT arm was prepped and draped in the usual sterile fashion.. The ruler used to measure and mark insertion area.  Pt was prepped with alcohol swab and then injected with 5 cc of 2% lidocaine with epinephrine.  Pt was prepped with betadine, Implanon removed form packaging,  Device confirmed in needle, then inserted full length of needle and withdrawn per handbook instructions.  Device palpated by physician and patient.  Pt insertion site covered with pressure dressing.   Minimal blood loss.  Pt tolerated the procedure well.    Assessment:    Normal postpartum exam. Pap smear done at today's visit.   Plan:   1. Contraception:  Nexplanon 2. Follow up in: 4 months or as needed.

## 2017-09-09 NOTE — Addendum Note (Signed)
Addended by: Anell BarrHOWARD, JENNIFER L on: 09/09/2017 11:03 AM   Modules accepted: Orders

## 2017-09-10 MED ORDER — ETONOGESTREL 68 MG ~~LOC~~ IMPL
68.0000 mg | DRUG_IMPLANT | Freq: Once | SUBCUTANEOUS | Status: AC
  Administered 2017-09-10: 68 mg via SUBCUTANEOUS

## 2017-09-10 NOTE — Addendum Note (Signed)
Addended by: Anell BarrHOWARD, JENNIFER L on: 09/10/2017 09:56 AM   Modules accepted: Orders

## 2017-09-12 LAB — CYTOLOGY - PAP

## 2017-09-13 ENCOUNTER — Encounter: Payer: Self-pay | Admitting: Family Medicine

## 2017-09-13 ENCOUNTER — Encounter (INDEPENDENT_AMBULATORY_CARE_PROVIDER_SITE_OTHER): Payer: Self-pay

## 2017-09-13 DIAGNOSIS — R87612 Low grade squamous intraepithelial lesion on cytologic smear of cervix (LGSIL): Secondary | ICD-10-CM | POA: Insufficient documentation

## 2017-10-21 ENCOUNTER — Encounter: Payer: Self-pay | Admitting: Family Medicine

## 2017-10-22 ENCOUNTER — Ambulatory Visit: Payer: Medicaid Other | Admitting: Advanced Practice Midwife

## 2017-10-22 ENCOUNTER — Telehealth: Payer: Self-pay

## 2017-10-22 ENCOUNTER — Inpatient Hospital Stay (HOSPITAL_COMMUNITY)
Admission: AD | Admit: 2017-10-22 | Discharge: 2017-10-22 | Disposition: A | Discharge status: Home / Self Care | Payer: Medicaid Other | Source: Ambulatory Visit | Attending: Obstetrics and Gynecology | Admitting: Obstetrics and Gynecology

## 2017-10-22 ENCOUNTER — Other Ambulatory Visit: Payer: Self-pay

## 2017-10-22 ENCOUNTER — Encounter (HOSPITAL_COMMUNITY): Payer: Self-pay | Admitting: *Deleted

## 2017-10-22 DIAGNOSIS — Z833 Family history of diabetes mellitus: Secondary | ICD-10-CM | POA: Diagnosis not present

## 2017-10-22 DIAGNOSIS — Z8249 Family history of ischemic heart disease and other diseases of the circulatory system: Secondary | ICD-10-CM | POA: Diagnosis not present

## 2017-10-22 DIAGNOSIS — N909 Noninflammatory disorder of vulva and perineum, unspecified: Secondary | ICD-10-CM

## 2017-10-22 DIAGNOSIS — L259 Unspecified contact dermatitis, unspecified cause: Secondary | ICD-10-CM

## 2017-10-22 DIAGNOSIS — N9089 Other specified noninflammatory disorders of vulva and perineum: Secondary | ICD-10-CM | POA: Diagnosis not present

## 2017-10-22 DIAGNOSIS — Z9889 Other specified postprocedural states: Secondary | ICD-10-CM | POA: Insufficient documentation

## 2017-10-22 DIAGNOSIS — N898 Other specified noninflammatory disorders of vagina: Secondary | ICD-10-CM | POA: Diagnosis not present

## 2017-10-22 DIAGNOSIS — Z87891 Personal history of nicotine dependence: Secondary | ICD-10-CM | POA: Insufficient documentation

## 2017-10-22 LAB — URINALYSIS, ROUTINE W REFLEX MICROSCOPIC
BACTERIA UA: NONE SEEN
Bilirubin Urine: NEGATIVE
Glucose, UA: NEGATIVE mg/dL
Ketones, ur: NEGATIVE mg/dL
Leukocytes, UA: NEGATIVE
Nitrite: NEGATIVE
PROTEIN: NEGATIVE mg/dL
Specific Gravity, Urine: 1.024 (ref 1.005–1.030)
pH: 6 (ref 5.0–8.0)

## 2017-10-22 LAB — POCT PREGNANCY, URINE: PREG TEST UR: NEGATIVE

## 2017-10-22 MED ORDER — LIDOCAINE HCL URETHRAL/MUCOSAL 2 % EX GEL
1.0000 "application " | Freq: Once | CUTANEOUS | Status: AC
  Administered 2017-10-22: 1 via TOPICAL
  Filled 2017-10-22: qty 5

## 2017-10-22 NOTE — Telephone Encounter (Signed)
Pt left MyChart message stating that she shaved a few days ago and is having irritation around her vaginal opening. Pt stated that she wants to come in to be seen. I called pt to schedule an appt for today at 10 am pt verbalized understanding.

## 2017-10-22 NOTE — Discharge Instructions (Signed)

## 2017-10-22 NOTE — MAU Provider Note (Signed)
Chief Complaint:  Vaginal Itching   First Provider Initiated Contact with Patient 10/22/17 2148       HPI: Stacie Harding is a 22 y.o. G1P1001 who presents to maternity admissions reporting painful irritation to vulvar area which she attributes to shaving there.  Burns when touched or when urinating.  Denies risk of STDs, does not want to be tested for those.. She reports no vaginal bleeding, urinary symptoms, h/a, dizziness, n/v, or fever/chills.    Vaginal Itching  The patient's primary symptoms include genital lesions. The patient's pertinent negatives include no genital itching, genital odor, pelvic pain, vaginal bleeding or vaginal discharge. This is a new problem. The current episode started in the past 7 days. The problem occurs constantly. The problem has been unchanged. She is not pregnant. Pertinent negatives include no abdominal pain, back pain, chills, constipation, diarrhea, fever, frequency, headaches, nausea or vomiting. The symptoms are aggravated by tactile pressure. She has tried nothing for the symptoms.   RN Note Pt reports that she shaved 4 days ago and has had vaginal irritation since that time. She also has some irritation when urinating. Pt started cycle on Friday. Had Nexplanon placed at postpartum visit    Past Medical History: History reviewed. No pertinent past medical history.  Past obstetric history: OB History  Gravida Para Term Preterm AB Living  SAB TAB Ectopic Multiple Live Births        0 1    # Outcome Date GA Lbr Len/2nd Weight Sex Delivery Anes PTL Lv  1 Term 08/05/17 [redacted]w[redacted]d 06:34 / 00:19 8 lb 0.9 oz (3.655 kg) M Vag-Spont EPI  LIV    Past Surgical History: Past Surgical History:  Procedure Laterality Date  . KNEE SURGERY      Family History: Family History  Problem Relation Age of Onset  . Diabetes Maternal Grandmother   . Hypertension Maternal Grandmother   . Sudden death Neg Hx   . Hyperlipidemia Neg Hx   . Heart attack  Neg Hx     Social History: Social History   Tobacco Use  . Smoking status: Former Smoker    Last attempt to quit: 11/2016    Years since quitting: 0.9  . Smokeless tobacco: Never Used  Substance Use Topics  . Alcohol use: No  . Drug use: No    Allergies: No Known Allergies  Meds:  No medications prior to admission.    I have reviewed patient's Past Medical Hx, Surgical Hx, Family Hx, Social Hx, medications and allergies.  ROS:  Review of Systems  Constitutional: Negative for chills and fever.  Gastrointestinal: Negative for abdominal pain, constipation, diarrhea, nausea and vomiting.  Genitourinary: Negative for frequency, pelvic pain and vaginal discharge.  Musculoskeletal: Negative for back pain.  Neurological: Negative for headaches.   Other systems negative     Physical Exam   Patient Vitals for the past 24 hrs:  BP Temp Pulse Resp SpO2 Height Weight  10/22/17 2026 121/89 98.6 F (37 C) 70 16 100 %  (1.6 m) 128 lb (58.1 kg)   Constitutional: Well-developed, well-nourished female in no acute distress.  Cardiovascular: normal rate and rhythm Respiratory: normal effort, no distress GI: Abd soft, non-tender.  Nondistended.  MS: Extremities nontender, no edema, normal ROM Neurologic: Alert and oriented x 4.   Grossly nonfocal. GU: Neg CVAT. Skin:  Warm and Dry Psych:  Affect appropriate.  PELVIC EXAM: EGBUS:  Shaved perineum and vulva  At introitus, there are kissing lesions. Tiny ulcerations scattered.  White centers.                                                   Moist.  Very tender to touch.  HSV cultures obtained.   Labs: Results for orders placed or performed during the hospital encounter of 10/22/17 (from the past 24 hour(s))  Pregnancy, urine POC     Status: None   Collection Time: 10/22/17  9:37 PM  Result Value Ref Range   Preg Test, Ur NEGATIVE NEGATIVE   --/--/A POS, A POS Performed at [facility], [address], [place], [place] [address]  [phone]  Imaging:  No results found.  MAU Course/MDM: I have ordered labs as follows:  HSV culture, Discussed these lesions could be razor burn or HSV Imaging ordered: none    Treatments in MAU included topical lidocaine .   Pt stable at time of discharge.  Assessment: Vulvar lesions Vulvar burning Razor burn vs HSV  Plan: Discharge home Recommend keep area clean and dry WIll send culture and treat accordingly if positive   Since they appeared right after shaving, they could represent razor burn.  But cannot exclude HSV yet  Encouraged to return here or to other Urgent Care/ED if she develops worsening of symptoms, increase in pain, fever, or other concerning symptoms.   Wynelle Bourgeois CNM, MSN Certified Nurse-Midwife 10/22/2017 9:48 PM

## 2017-10-22 NOTE — MAU Note (Signed)
Pt reports that she shaved 4 days ago and has had vaginal irritation since that time. She also has some irritation when urinating. Pt started cycle on Friday. Had Nexplanon placed at postpartum visit

## 2017-10-31 ENCOUNTER — Ambulatory Visit: Payer: Medicaid Other | Admitting: Family Medicine

## 2018-01-17 ENCOUNTER — Ambulatory Visit: Payer: Medicaid Other | Admitting: Family Medicine

## 2018-01-17 DIAGNOSIS — Z30017 Encounter for initial prescription of implantable subdermal contraceptive: Secondary | ICD-10-CM

## 2018-05-08 ENCOUNTER — Other Ambulatory Visit: Payer: Self-pay

## 2018-05-08 ENCOUNTER — Encounter (HOSPITAL_COMMUNITY): Payer: Self-pay | Admitting: Emergency Medicine

## 2018-05-08 ENCOUNTER — Ambulatory Visit (HOSPITAL_COMMUNITY): Payer: Self-pay

## 2018-05-08 ENCOUNTER — Ambulatory Visit (HOSPITAL_COMMUNITY)
Admission: EM | Admit: 2018-05-08 | Discharge: 2018-05-08 | Disposition: A | Discharge status: Home / Self Care | Payer: Self-pay | Attending: Nurse Practitioner | Admitting: Nurse Practitioner

## 2018-05-08 DIAGNOSIS — R0789 Other chest pain: Secondary | ICD-10-CM

## 2018-05-08 DIAGNOSIS — J209 Acute bronchitis, unspecified: Secondary | ICD-10-CM

## 2018-05-08 HISTORY — DX: Essential (primary) hypertension: I10

## 2018-05-08 MED ORDER — IBUPROFEN 800 MG PO TABS: 800.0000 mg | ORAL_TABLET | Freq: Three times a day (TID) | ORAL | 0 refills | Status: DC

## 2018-05-08 MED ORDER — PREDNISONE 20 MG PO TABS: 40.0000 mg | ORAL_TABLET | Freq: Every day | ORAL | 0 refills | Status: AC

## 2018-05-08 MED ORDER — GUAIFENESIN 100 MG/5ML PO LIQD: 100.0000 mg | ORAL | 0 refills | Status: DC | PRN

## 2018-05-08 MED ORDER — AZITHROMYCIN 250 MG PO TABS: 250.0000 mg | ORAL_TABLET | Freq: Every day | ORAL | 0 refills | Status: DC

## 2018-05-08 NOTE — ED Triage Notes (Signed)
PT reports cough for a month. Pain across ribs as well. PT felt her rib pop after a violent coughing spell.

## 2018-05-08 NOTE — ED Provider Notes (Signed)
[facility]    CSN: [id number] Arrival date & time: 05/08/18  1119     History   Chief Complaint Chief Complaint  Patient presents with  . Cough    HPI Stacie Harding is a 22 y.o. female.   Subjective:   Stacie Harding is a 22 y.o. female here for evaluation of a cough.  The cough is non-productive, without wheezing, dyspnea or hemoptysis, waxing and waning over time and is aggravated by nothing. Onset of symptoms was 1 month ago and has gradually worsened over the past couple of days.  Patient reports that she was coughing last night and started having increased pain in the center of her chest and right rib area.  She is concerned that she may have cracked a rib while coughing.  Pain is worse with deep breathing.  She denies any shortness of breath or wheezing.  Associated symptoms include rhinorrhea. Patient does not have a history of asthma. Patient has not had recent travel. Patient does have a history of smoking. Patient  has not had a previous chest x-ray.   The following portions of the patient's history were reviewed and updated as appropriate: allergies, current medications, past family history, past medical history, past social history, past surgical history and problem list.            Past Medical History:  Diagnosis Date  . Hypertension     Patient Active Problem List   Diagnosis Date Noted  . LGSIL on Pap smear of cervix 09/13/2017  . Left knee injury 02/21/2012    Past Surgical History:  Procedure Laterality Date  . KNEE SURGERY      OB History    Gravida  1   Para  1   Term  1   Preterm      AB      Living  1     SAB      TAB      Ectopic      Multiple  0   Live Births  1            Home Medications    Prior to Admission medications   Medication Sig Start Date End Date Taking? Authorizing Provider  azithromycin (ZITHROMAX) 250 MG tablet Take 1 tablet (250 mg total) by mouth daily. Take first 2 tablets together,  then 1 every day until finished. 05/08/18   Lurline Idol, FNP  guaiFENesin (ROBITUSSIN) 100 MG/5ML liquid Take 5-10 mLs (100-200 mg total) by mouth every 4 (four) hours as needed for cough. 05/08/18   Lurline Idol, FNP  ibuprofen (ADVIL,MOTRIN) 800 MG tablet Take 1 tablet (800 mg total) by mouth 3 (three) times daily. 05/08/18   Lurline Idol, FNP  predniSONE (DELTASONE) 20 MG tablet Take 2 tablets (40 mg total) by mouth daily for 5 days. 05/08/18 05/13/18  Lurline Idol, FNP    Family History Family History  Problem Relation Age of Onset  . Diabetes Maternal Grandmother   . Hypertension Maternal Grandmother   . Sudden death Neg Hx   . Hyperlipidemia Neg Hx   . Heart attack Neg Hx     Social History Social History   Tobacco Use  . Smoking status: Former Smoker    Last attempt to quit: 11/2016    Years since quitting: 1.5  . Smokeless tobacco: Never Used  Substance Use Topics  . Alcohol use: No  . Drug use: No     Allergies   Iodine  Review of Systems Review of Systems  Constitutional: Negative for fever.  HENT: Positive for rhinorrhea. Negative for congestion, ear pain and sore throat.   Eyes: Negative.   Respiratory: Positive for cough. Negative for shortness of breath and wheezing.   Cardiovascular: Positive for chest pain. Negative for palpitations.  Gastrointestinal: Negative.   Neurological: Negative.   All other systems reviewed and are negative.    Physical Exam Triage Vital Signs ED Triage Vitals  Enc Vitals Group     BP 05/08/18 1242 133/83     Pulse Rate 05/08/18 1242 64     Resp 05/08/18 1242 16     Temp 05/08/18 1242 98.5 F (36.9 C)     Temp Source 05/08/18 1242 Oral     SpO2 05/08/18 1242 100 %     Weight --      Height --      Head Circumference --      Peak Flow --      Pain Score 05/08/18 1243 8     Pain Loc --      Pain Edu? --      Excl. in GC? --    No data found.  Updated Vital Signs BP 133/83   Pulse 64    Temp 98.5 F (36.9 C) (Oral)   Resp 16   LMP 04/17/2018   SpO2 100%   Visual Acuity Right Eye Distance:   Left Eye Distance:   Bilateral Distance:    Right Eye Near:   Left Eye Near:    Bilateral Near:     Physical Exam  Constitutional: She is oriented to person, place, and time. She appears well-developed and well-nourished.  HENT:  Head: Normocephalic.  Eyes: Pupils are equal, round, and reactive to light. Conjunctivae and EOM are normal.  Neck: Normal range of motion. Neck supple.  Cardiovascular: Normal rate, regular rhythm and normal heart sounds.  Pulmonary/Chest: Effort normal and breath sounds normal. She exhibits tenderness.  Musculoskeletal: Normal range of motion.  Lymphadenopathy:    She has no cervical adenopathy.  Neurological: She is alert and oriented to person, place, and time.  Skin: Skin is warm and dry.  Psychiatric: She has a normal mood and affect.     UC Treatments / Results  Labs (all labs ordered are listed, but only abnormal results are displayed) Labs Reviewed - No data to display  EKG None  Radiology No results found.  Procedures Procedures (including critical care time)  Medications Ordered in UC Medications - No data to display  Initial Impression / Assessment and Plan / UC Course  I have reviewed the triage vital signs and the nursing notes.  Pertinent labs & imaging results that were available during my care of the patient were reviewed by me and considered in my medical decision making (see chart for details).     22 year old female presenting with a one-month history of cough chest worsening over the past couple of days.  Patient now has increased pain in the center of her chest and right rib area after a coughing spell last evening.  Patient is concerned that she may have cracked a rib while coughing.  Pain is worse with deep breathing but there is no shortness of breath or wheezing. Patient is nontoxic-appearing.  Afebrile.   Oxygen saturations 100% on room air.  She is not tachycardic.  There is chest wall tenderness and right rib tenderness noted on exam.  Is not in any distress.  Offered chest/rib  x-ray but patient subsequently declined due to lack of insurance.  Discussed conservative treatment at this time as well is indications for immediate ED follow-up.  Patient verbalized understanding and agreeable with this proposed plan of care.  Questions have been answered and no other concerns have been voiced at this time.  Antibiotics, steroids and NSAIDs per medication orders. Smoking cessation encouraged.  Report to ED immediately for shortness of breath, blood in sputum, change in character of cough, development of fever or chills, inability to maintain nutrition and hydration or any other concerns.  Today's evaluation has revealed no signs of a dangerous process. Discussed diagnosis with patient. Patient aware of their diagnosis, possible red flag symptoms to watch out for and need for close follow up. Patient understands verbal and written discharge instructions. Patient comfortable with plan and disposition.  Patient has a clear mental status at this time, good insight into illness (after discussion and teaching) and has clear judgment to make decisions regarding their care.  Documentation was completed with the aid of voice recognition software. Transcription may contain typographical errors. Final Clinical Impressions(s) / UC Diagnoses   Final diagnoses:  Acute bronchitis, unspecified organism  Acute chest wall pain     Discharge Instructions     Take medications as prescribed. Go to ED immediately for shortness of breath, blood in sputum, change in character of cough, development of fever or chills, inability to maintain nutrition and hydration or any other concerns.      ED Prescriptions    Medication Sig Dispense Auth. Provider   azithromycin (ZITHROMAX) 250 MG tablet Take 1 tablet (250 mg total) by  mouth daily. Take first 2 tablets together, then 1 every day until finished. 6 tablet Lurline Idol, FNP   predniSONE (DELTASONE) 20 MG tablet Take 2 tablets (40 mg total) by mouth daily for 5 days. 10 tablet Lurline Idol, FNP   guaiFENesin (ROBITUSSIN) 100 MG/5ML liquid Take 5-10 mLs (100-200 mg total) by mouth every 4 (four) hours as needed for cough. 60 mL Lurline Idol, FNP   ibuprofen (ADVIL,MOTRIN) 800 MG tablet Take 1 tablet (800 mg total) by mouth 3 (three) times daily. 21 tablet Lurline Idol, FNP     Controlled Substance Prescriptions [place] Controlled Substance Registry consulted? Not Applicable   Lurline Idol, FNP 05/08/18 1320

## 2018-05-08 NOTE — Discharge Instructions (Addendum)
Take medications as prescribed. Go to ED immediately for shortness of breath, blood in sputum, change in character of cough, development of fever or chills, inability to maintain nutrition and hydration or any other concerns.

## 2018-06-10 ENCOUNTER — Encounter: Payer: Self-pay | Admitting: Emergency Medicine

## 2018-06-10 ENCOUNTER — Ambulatory Visit
Admission: EM | Admit: 2018-06-10 | Discharge: 2018-06-10 | Disposition: A | Discharge status: Home / Self Care | Payer: Self-pay | Attending: Physician Assistant | Admitting: Physician Assistant

## 2018-06-10 DIAGNOSIS — H60391 Other infective otitis externa, right ear: Secondary | ICD-10-CM | POA: Insufficient documentation

## 2018-06-10 MED ORDER — NEOMYCIN-POLYMYXIN-HC 3.5-10000-1 OT SUSP: 4.0000 [drp] | Freq: Three times a day (TID) | OTIC | 0 refills | Status: AC

## 2018-06-10 MED ORDER — IPRATROPIUM BROMIDE 0.06 % NA SOLN: 2.0000 | Freq: Four times a day (QID) | NASAL | 12 refills | Status: DC

## 2018-06-10 MED ORDER — FLUTICASONE PROPIONATE 50 MCG/ACT NA SUSP: 2.0000 | Freq: Every day | NASAL | 0 refills | Status: DC

## 2018-06-10 NOTE — ED Triage Notes (Signed)
Pt presents to [facility] for assessment of right ear pain x 2 days, states hearing is bad.

## 2018-06-10 NOTE — Discharge Instructions (Signed)
Start cortisporin as directed. Start flonase, atrovent nasal spray for nasal congestion/drainage. You can use over the counter nasal saline rinse such as neti pot for nasal congestion. Keep hydrated, your urine should be clear to pale yellow in color. Tylenol/motrin for fever and pain. Recheck as needed.

## 2018-06-10 NOTE — ED Provider Notes (Signed)
[facility]    CSN: [id number] Arrival date & time: 06/10/18  1054     History   Chief Complaint Chief Complaint  Patient presents with  . Otalgia    HPI Stacie Harding is a 23 y.o. female.   23 year old female comes in for 2-day history of right ear pain.  She has had a few day history of URI symptoms including cough, rhinorrhea, nasal congestion.  Denies fever, chills, night sweats.  She has used cotton swabs to clean her ear as well.  Denies ear drainage.  Does endorse decrease in hearing.  States about over-the-counter eardrops without relief.     Past Medical History:  Diagnosis Date  . Hypertension     Patient Active Problem List   Diagnosis Date Noted  . LGSIL on Pap smear of cervix 09/13/2017  . Left knee injury 02/21/2012    Past Surgical History:  Procedure Laterality Date  . KNEE SURGERY      OB History    Gravida  1   Para  1   Term  1   Preterm      AB      Living  1     SAB      TAB      Ectopic      Multiple  0   Live Births  1            Home Medications    Prior to Admission medications   Medication Sig Start Date End Date Taking? Authorizing Provider  fluticasone (FLONASE) 50 MCG/ACT nasal spray Place 2 sprays into both nostrils daily. 06/10/18   Cathie Hoops, Adah Stoneberg V, PA-C  guaiFENesin (ROBITUSSIN) 100 MG/5ML liquid Take 5-10 mLs (100-200 mg total) by mouth every 4 (four) hours as needed for cough. 05/08/18   Lurline Idol, FNP  ibuprofen (ADVIL,MOTRIN) 800 MG tablet Take 1 tablet (800 mg total) by mouth 3 (three) times daily. 05/08/18   Lurline Idol, FNP  ipratropium (ATROVENT) 0.06 % nasal spray Place 2 sprays into both nostrils 4 (four) times daily. 06/10/18   Cathie Hoops, Lashala Laser V, PA-C  neomycin-polymyxin-hydrocortisone (CORTISPORIN) 3.5-10000-1 OTIC suspension Place 4 drops into the right ear 3 (three) times daily for 7 days. 06/10/18 06/17/18  Belinda Fisher, PA-C    Family History Family History  Problem Relation Age of Onset    . Diabetes Maternal Grandmother   . Hypertension Maternal Grandmother   . Sudden death Neg Hx   . Hyperlipidemia Neg Hx   . Heart attack Neg Hx     Social History Social History   Tobacco Use  . Smoking status: Former Smoker    Last attempt to quit: 11/2016    Years since quitting: 1.6  . Smokeless tobacco: Never Used  Substance Use Topics  . Alcohol use: No  . Drug use: No     Allergies   Iodine   Review of Systems Review of Systems  Reason unable to perform ROS: See HPI as above.     Physical Exam Triage Vital Signs ED Triage Vitals [06/10/18 1056]  Enc Vitals Group     BP 123/85     Pulse Rate 67     Resp 16     Temp 97.9 F (36.6 C)     Temp Source Oral     SpO2 98 %     Weight      Height      Head Circumference      Peak Flow  Pain Score 10     Pain Loc      Pain Edu?      Excl. in GC?    No data found.  Updated Vital Signs BP 123/85 (BP Location: Left Arm)   Pulse 67   Temp 97.9 F (36.6 C) (Oral)   Resp 16   LMP 05/10/2018   SpO2 98%   Physical Exam Constitutional:      General: She is not in acute distress.    Appearance: She is well-developed. She is not ill-appearing, toxic-appearing or diaphoretic.  HENT:     Head: Normocephalic and atraumatic.     Right Ear: External ear normal.     Left Ear: Ear canal and external ear normal.     Ears:     Comments: Tenderness to palpation of right tragus.  Swelling to the ear canal.  Cerumen impaction.  TM partially visible, without obvious erythema or bulging.  No tenderness to palpation of left tragus.  Cerumen impaction.  TM not visible.    Nose: Nose normal.     Right Sinus: No maxillary sinus tenderness or frontal sinus tenderness.     Left Sinus: No maxillary sinus tenderness or frontal sinus tenderness.     Mouth/Throat:     Pharynx: Uvula midline.  Eyes:     Conjunctiva/sclera: Conjunctivae normal.     Pupils: Pupils are equal, round, and reactive to light.  Neck:      Musculoskeletal: Normal range of motion and neck supple.  Cardiovascular:     Rate and Rhythm: Normal rate and regular rhythm.     Heart sounds: Normal heart sounds. No murmur. No friction rub. No gallop.   Pulmonary:     Effort: Pulmonary effort is normal.     Breath sounds: Normal breath sounds. No decreased breath sounds, wheezing, rhonchi or rales.  Lymphadenopathy:     Cervical: No cervical adenopathy.  Skin:    General: Skin is warm and dry.  Neurological:     Mental Status: She is alert and oriented to person, place, and time.  Psychiatric:        Behavior: Behavior normal.        Judgment: Judgment normal.      UC Treatments / Results  Labs (all labs ordered are listed, but only abnormal results are displayed) Labs Reviewed - No data to display  EKG None  Radiology No results found.  Procedures Procedures (including critical care time)  Medications Ordered in UC Medications - No data to display  Initial Impression / Assessment and Plan / UC Course  I have reviewed the triage vital signs and the nursing notes.  Pertinent labs & imaging results that were available during my care of the patient were reviewed by me and considered in my medical decision making (see chart for details).    Start Cortisporin for otitis externa.  Flonase, Atrovent for nasal congestion/drainage.  Other symptomatic treatment discussed.  Push fluids.  Return precautions given.  Patient expresses understanding and agrees to plan.  Final Clinical Impressions(s) / UC Diagnoses   Final diagnoses:  Other infective acute otitis externa of right ear    ED Prescriptions    Medication Sig Dispense Auth. Provider   neomycin-polymyxin-hydrocortisone (CORTISPORIN) 3.5-10000-1 OTIC suspension Place 4 drops into the right ear 3 (three) times daily for 7 days. 4.2 mL Maris Bena V, PA-C   fluticasone (FLONASE) 50 MCG/ACT nasal spray Place 2 sprays into both nostrils daily. 1 g Belinda FisherYu, Atreus Hasz V, PA-C  ipratropium (ATROVENT) 0.06 % nasal spray Place 2 sprays into both nostrils 4 (four) times daily. 15 mL Threasa Alpha, [place] 06/10/18 1213

## 2018-06-10 NOTE — ED Notes (Signed)
Patient able to ambulate independently  

## 2018-06-19 ENCOUNTER — Other Ambulatory Visit: Payer: Self-pay

## 2018-06-19 ENCOUNTER — Ambulatory Visit
Admission: EM | Admit: 2018-06-19 | Discharge: 2018-06-19 | Disposition: A | Discharge status: Home / Self Care | Payer: Self-pay | Attending: Physician Assistant | Admitting: Physician Assistant

## 2018-06-19 DIAGNOSIS — H6123 Impacted cerumen, bilateral: Secondary | ICD-10-CM | POA: Insufficient documentation

## 2018-06-19 DIAGNOSIS — H6691 Otitis media, unspecified, right ear: Secondary | ICD-10-CM | POA: Insufficient documentation

## 2018-06-19 MED ORDER — AMOXICILLIN 500 MG PO CAPS: 500.0000 mg | ORAL_CAPSULE | Freq: Three times a day (TID) | ORAL | 0 refills | Status: DC

## 2018-06-19 NOTE — ED Triage Notes (Signed)
Pt c/o rt ear fullness x1wk, states has been sick with strep a month ago

## 2018-06-19 NOTE — ED Provider Notes (Signed)
[facility]    CSN: [id number] Arrival date & time: 06/19/18  1210     History   Chief Complaint Chief Complaint  Patient presents with  . Otalgia    HPI Stacie Harding is a 23 y.o. female.   The history is provided by the patient. No language interpreter was used.  Otalgia  Location:  Right Behind ear:  No abnormality Quality:  Aching Severity:  Moderate Onset quality:  Sudden Timing:  Constant Progression:  Worsening Chronicity:  New Relieved by:  Nothing Worsened by:  Nothing Ineffective treatments:  None tried Associated symptoms: ear discharge   Pt complains of wax in ears and ear infection   Past Medical History:  Diagnosis Date  . Hypertension     Patient Active Problem List   Diagnosis Date Noted  . LGSIL on Pap smear of cervix 09/13/2017  . Left knee injury 02/21/2012    Past Surgical History:  Procedure Laterality Date  . KNEE SURGERY      OB History    Gravida  1   Para  1   Term  1   Preterm      AB      Living  1     SAB      TAB      Ectopic      Multiple  0   Live Births  1            Home Medications    Prior to Admission medications   Medication Sig Start Date End Date Taking? Authorizing Provider  amoxicillin (AMOXIL) 500 MG capsule Take 1 capsule (500 mg total) by mouth 3 (three) times daily. 06/19/18   Elson Areas, PA-C  fluticasone (FLONASE) 50 MCG/ACT nasal spray Place 2 sprays into both nostrils daily. 06/10/18   Cathie Hoops, Amy V, PA-C  guaiFENesin (ROBITUSSIN) 100 MG/5ML liquid Take 5-10 mLs (100-200 mg total) by mouth every 4 (four) hours as needed for cough. 05/08/18   Lurline Idol, FNP  ibuprofen (ADVIL,MOTRIN) 800 MG tablet Take 1 tablet (800 mg total) by mouth 3 (three) times daily. 05/08/18   Lurline Idol, FNP  ipratropium (ATROVENT) 0.06 % nasal spray Place 2 sprays into both nostrils 4 (four) times daily. 06/10/18   Belinda Fisher, PA-C    Family History Family History  Problem Relation  Age of Onset  . Diabetes Maternal Grandmother   . Hypertension Maternal Grandmother   . Sudden death Neg Hx   . Hyperlipidemia Neg Hx   . Heart attack Neg Hx     Social History Social History   Tobacco Use  . Smoking status: Former Smoker    Last attempt to quit: 11/2016    Years since quitting: 1.6  . Smokeless tobacco: Never Used  Substance Use Topics  . Alcohol use: No  . Drug use: No     Allergies   Iodine   Review of Systems Review of Systems  HENT: Positive for ear discharge and ear pain.   All other systems reviewed and are negative.    Physical Exam Triage Vital Signs ED Triage Vitals  Enc Vitals Group     BP 06/19/18 1255 114/77     Pulse Rate 06/19/18 1255 74     Resp 06/19/18 1255 18     Temp 06/19/18 1255 98.1 F (36.7 C)     Temp Source 06/19/18 1255 Oral     SpO2 06/19/18 1255 98 %     Weight --  Height --      Head Circumference --      Peak Flow --      Pain Score 06/19/18 1256 5     Pain Loc --      Pain Edu? --      Excl. in GC? --    No data found.  Updated Vital Signs BP 114/77 (BP Location: Right Arm)   Pulse 74   Temp 98.1 F (36.7 C) (Oral)   Resp 18   LMP 06/12/2018   SpO2 98%   Visual Acuity Right Eye Distance:   Left Eye Distance:   Bilateral Distance:    Right Eye Near:   Left Eye Near:    Bilateral Near:     Physical Exam Vitals signs reviewed.  HENT:     Head: Normocephalic.     Comments: Right tm erythematous     Right Ear: There is impacted cerumen.     Left Ear: There is impacted cerumen.     Mouth/Throat:     Mouth: Mucous membranes are moist.  Eyes:     Pupils: Pupils are equal, round, and reactive to light.  Neck:     Musculoskeletal: Normal range of motion.  Cardiovascular:     Rate and Rhythm: Normal rate.  Pulmonary:     Effort: Pulmonary effort is normal.  Neurological:     Mental Status: She is alert.      UC Treatments / Results  Labs (all labs ordered are listed, but only  abnormal results are displayed) Labs Reviewed - No data to display  EKG None  Radiology No results found.  Procedures Procedures (including critical care time)  Medications Ordered in UC Medications - No data to display  Initial Impression / Assessment and Plan / UC Course  I have reviewed the triage vital signs and the nursing notes.  Pertinent labs & imaging results that were available during my care of the patient were reviewed by me and considered in my medical decision making (see chart for details).     Right tm erythematous, left tm clear,    Final Clinical Impressions(s) / UC Diagnoses   Final diagnoses:  Bilateral impacted cerumen  Right otitis media, unspecified otitis media type     Discharge Instructions     Return if any problems.    ED Prescriptions    Medication Sig Dispense Auth. Provider   amoxicillin (AMOXIL) 500 MG capsule Take 1 capsule (500 mg total) by mouth 3 (three) times daily. 30 capsule Elson AreasSofia, Kalya Troeger K, [place]-C     Controlled Substance Prescriptions [place] Controlled Substance Registry consulted? Not Applicable  An After Visit Summary was printed and given to the patient.    Elson AreasSofia, Burnett Lieber K, [place]-C 06/19/18 1732

## 2018-06-19 NOTE — Discharge Instructions (Addendum)
Return if any problems.

## 2018-08-06 ENCOUNTER — Telehealth: Payer: Self-pay

## 2018-08-06 NOTE — Telephone Encounter (Signed)
Patient called stating that she has had irregular bleeding on nexplanon. She is also complaining about extreme mood swings and this is not like her. Patient given appointment for Monday with Dr. Adrian Blackwater to discuss. Armandina Stammer RN

## 2018-08-11 ENCOUNTER — Ambulatory Visit: Payer: Self-pay | Admitting: Family Medicine

## 2018-08-14 ENCOUNTER — Ambulatory Visit (INDEPENDENT_AMBULATORY_CARE_PROVIDER_SITE_OTHER): Payer: BLUE CROSS/BLUE SHIELD | Admitting: Family Medicine

## 2018-08-14 ENCOUNTER — Encounter: Payer: Self-pay | Admitting: Family Medicine

## 2018-08-14 ENCOUNTER — Other Ambulatory Visit: Payer: Self-pay

## 2018-08-14 VITALS — BP 118/79 | HR 71 | Ht 63.0 in | Wt 121.1 lb

## 2018-08-14 DIAGNOSIS — F32A Depression, unspecified: Secondary | ICD-10-CM

## 2018-08-14 DIAGNOSIS — F329 Major depressive disorder, single episode, unspecified: Secondary | ICD-10-CM

## 2018-08-14 MED ORDER — CITALOPRAM HYDROBROMIDE 20 MG PO TABS: 20.0000 mg | ORAL_TABLET | Freq: Every day | ORAL | 12 refills | Status: DC

## 2018-08-14 NOTE — Progress Notes (Signed)
   Subjective:    Patient ID: Stacie Harding, female    DOB: 13-Jun-1995, 23 y.o.   MRN: [id number]  HPI Patient seen for mood swings and crying without reason that is been worsening over the past 6 months.  She is currently seeing counseling and feels that this is helpful, but still has depressed mood.  She does have a Nexplanon, which was placed approximately a year ago. Also has lack of appetite.   Review of Systems     Objective:   Physical Exam Constitutional:      Appearance: Normal appearance.  Cardiovascular:     Rate and Rhythm: Normal rate.  Pulmonary:     Effort: Pulmonary effort is normal.  Skin:    General: Skin is warm and dry.     Capillary Refill: Capillary refill takes less than 2 seconds.       Neurological:     General: No focal deficit present.     Mental Status: She is alert.       Assessment & Plan:  1. Depression, unspecified depression type Discussed uncertainty as to whether symptoms are caused by Nexplanon versus other things.  Discussed possibly starting antidepressants versus removing Nexplanon.  Patient would like to start low-dose antidepressants.  I did discuss side effects of antidepressants including nausea, upset stomach, decreased libido, weight gain.  Start celexa 20mg  daily.  Follow-up in 2 to 3 months for reevaluation and annual exam

## 2018-08-21 ENCOUNTER — Encounter: Payer: Self-pay | Admitting: Family Medicine

## 2018-08-21 ENCOUNTER — Ambulatory Visit (INDEPENDENT_AMBULATORY_CARE_PROVIDER_SITE_OTHER): Payer: BLUE CROSS/BLUE SHIELD | Admitting: Family Medicine

## 2018-08-21 ENCOUNTER — Other Ambulatory Visit: Payer: Self-pay

## 2018-08-21 VITALS — BP 113/74 | HR 58 | Ht 63.0 in | Wt 123.0 lb

## 2018-08-21 DIAGNOSIS — Z3046 Encounter for surveillance of implantable subdermal contraceptive: Secondary | ICD-10-CM

## 2018-08-21 NOTE — Progress Notes (Signed)
Seen for follow up of birth control. Would like nexplanon removed. Isn't sure what she wants to do for contraception yet. Decided not to take antidepressant.  Nexplanon Removal:  Patient given informed consent for removal of her Implanon, time out was performed.  Signed copy in the chart.  Appropriate time out taken. Implanon site identified.  Area prepped in usual sterile fashon. One cc of 1% lidocaine was used to anesthetize the area at the distal end of the implant. A small stab incision was made right beside the implant on the distal portion.  The implanon rod was grasped using hemostats and removed without difficulty.  There was less than 3 cc blood loss. There were no complications.  A small amount of antibiotic ointment and steri-strips were applied over the small incision.  A pressure bandage was applied to reduce any bruising.  The patient tolerated the procedure well and was given post procedure instructions.

## 2018-08-28 ENCOUNTER — Ambulatory Visit: Payer: BLUE CROSS/BLUE SHIELD | Admitting: Family Medicine

## 2018-09-03 DIAGNOSIS — F99 Mental disorder, not otherwise specified: Secondary | ICD-10-CM | POA: Diagnosis not present

## 2018-09-25 DIAGNOSIS — F53 Postpartum depression: Secondary | ICD-10-CM | POA: Diagnosis not present

## 2018-10-01 DIAGNOSIS — F53 Postpartum depression: Secondary | ICD-10-CM | POA: Diagnosis not present

## 2018-10-24 ENCOUNTER — Encounter: Payer: Self-pay | Admitting: Family Medicine

## 2018-10-24 ENCOUNTER — Other Ambulatory Visit: Payer: Self-pay

## 2018-10-24 ENCOUNTER — Ambulatory Visit (INDEPENDENT_AMBULATORY_CARE_PROVIDER_SITE_OTHER): Payer: BLUE CROSS/BLUE SHIELD | Admitting: Family Medicine

## 2018-10-24 VITALS — BP 114/75 | HR 76 | Ht 63.0 in | Wt 122.1 lb

## 2018-10-24 DIAGNOSIS — N920 Excessive and frequent menstruation with regular cycle: Secondary | ICD-10-CM | POA: Diagnosis not present

## 2018-10-24 MED ORDER — NORETHIN ACE-ETH ESTRAD-FE 1-20 MG-MCG(24) PO TABS: 1.0000 | ORAL_TABLET | Freq: Every day | ORAL | 3 refills | Status: DC

## 2018-10-24 NOTE — Progress Notes (Addendum)
   Subjective:    Patient ID: Stacie Harding, female    DOB: 1996/04/10, 23 y.o.   MRN: [id number]  HPI Patient seen for regular menses with heavy bleeding and cramping. Currently on period now. Changing saturated pad frequently with severe cramping.   Had been on nexplanon, but this was removed due to prolonged bleeding.   Review of Systems     Objective:   Physical Exam Constitutional:      Appearance: Normal appearance.  Cardiovascular:     Rate and Rhythm: Normal rate and regular rhythm.     Pulses: Normal pulses.  Pulmonary:     Effort: Pulmonary effort is normal. No respiratory distress.     Breath sounds: Normal breath sounds. No wheezing or rales.  Neurological:     Mental Status: She is alert.       Assessment & Plan:  1. Menorrhagia with regular cycle Discussed options: IUD vs OCPs. Will start with ocps. Previous history of migraines, none now. Discussed if redevelops migraines, then may need to switch off OCPs.

## 2018-10-30 ENCOUNTER — Encounter: Payer: BLUE CROSS/BLUE SHIELD | Admitting: Family Medicine

## 2018-11-17 ENCOUNTER — Encounter: Payer: BLUE CROSS/BLUE SHIELD | Admitting: Family Medicine

## 2018-11-17 DIAGNOSIS — Z01419 Encounter for gynecological examination (general) (routine) without abnormal findings: Secondary | ICD-10-CM

## 2018-11-25 DIAGNOSIS — N3000 Acute cystitis without hematuria: Secondary | ICD-10-CM | POA: Diagnosis not present

## 2018-12-31 ENCOUNTER — Emergency Department (HOSPITAL_BASED_OUTPATIENT_CLINIC_OR_DEPARTMENT_OTHER)
Admission: EM | Admit: 2018-12-31 | Discharge: 2018-12-31 | Disposition: A | Discharge status: Home / Self Care | Payer: BC Managed Care – PPO | Attending: Emergency Medicine | Admitting: Emergency Medicine

## 2018-12-31 ENCOUNTER — Other Ambulatory Visit: Payer: Self-pay

## 2018-12-31 ENCOUNTER — Encounter (HOSPITAL_BASED_OUTPATIENT_CLINIC_OR_DEPARTMENT_OTHER): Payer: Self-pay | Admitting: *Deleted

## 2018-12-31 ENCOUNTER — Emergency Department (HOSPITAL_BASED_OUTPATIENT_CLINIC_OR_DEPARTMENT_OTHER): Payer: BC Managed Care – PPO

## 2018-12-31 DIAGNOSIS — R509 Fever, unspecified: Secondary | ICD-10-CM | POA: Diagnosis present

## 2018-12-31 DIAGNOSIS — B349 Viral infection, unspecified: Secondary | ICD-10-CM | POA: Diagnosis not present

## 2018-12-31 DIAGNOSIS — I10 Essential (primary) hypertension: Secondary | ICD-10-CM | POA: Diagnosis not present

## 2018-12-31 DIAGNOSIS — Z87891 Personal history of nicotine dependence: Secondary | ICD-10-CM | POA: Insufficient documentation

## 2018-12-31 LAB — URINALYSIS, ROUTINE W REFLEX MICROSCOPIC
Bilirubin Urine: NEGATIVE
Glucose, UA: NEGATIVE mg/dL
Ketones, ur: 15 mg/dL — AB
Leukocytes,Ua: NEGATIVE
Nitrite: NEGATIVE
Protein, ur: NEGATIVE mg/dL
Specific Gravity, Urine: 1.02 (ref 1.005–1.030)
pH: 6 (ref 5.0–8.0)

## 2018-12-31 LAB — CBC WITH DIFFERENTIAL/PLATELET
Abs Immature Granulocytes: 0.02 10*3/uL (ref 0.00–0.07)
Basophils Absolute: 0 10*3/uL (ref 0.0–0.1)
Basophils Relative: 1 %
Eosinophils Absolute: 0 10*3/uL (ref 0.0–0.5)
Eosinophils Relative: 0 %
HCT: 43.6 % (ref 36.0–46.0)
Hemoglobin: 14.5 g/dL (ref 12.0–15.0)
Immature Granulocytes: 0 %
Lymphocytes Relative: 16 %
Lymphs Abs: 0.7 10*3/uL (ref 0.7–4.0)
MCH: 31 pg (ref 26.0–34.0)
MCHC: 33.3 g/dL (ref 30.0–36.0)
MCV: 93.4 fL (ref 80.0–100.0)
Monocytes Absolute: 0.4 10*3/uL (ref 0.1–1.0)
Monocytes Relative: 8 %
Neutro Abs: 3.4 10*3/uL (ref 1.7–7.7)
Neutrophils Relative %: 75 %
Platelets: 134 10*3/uL — ABNORMAL LOW (ref 150–400)
RBC: 4.67 MIL/uL (ref 3.87–5.11)
RDW: 12.1 % (ref 11.5–15.5)
WBC: 4.6 10*3/uL (ref 4.0–10.5)
nRBC: 0 % (ref 0.0–0.2)

## 2018-12-31 LAB — COMPREHENSIVE METABOLIC PANEL
ALT: 23 U/L (ref 0–44)
AST: 32 U/L (ref 15–41)
Albumin: 3.8 g/dL (ref 3.5–5.0)
Alkaline Phosphatase: 71 U/L (ref 38–126)
Anion gap: 10 (ref 5–15)
BUN: 9 mg/dL (ref 6–20)
CO2: 22 mmol/L (ref 22–32)
Calcium: 8.3 mg/dL — ABNORMAL LOW (ref 8.9–10.3)
Chloride: 106 mmol/L (ref 98–111)
Creatinine, Ser: 0.72 mg/dL (ref 0.44–1.00)
GFR calc Af Amer: >60 mL/min (ref >60)
GFR calc non Af Amer: >60 mL/min (ref >60)
Glucose, Bld: 90 mg/dL (ref 70–99)
Potassium: 3.2 mmol/L — ABNORMAL LOW (ref 3.5–5.1)
Sodium: 138 mmol/L (ref 135–145)
Total Bilirubin: 1.2 mg/dL (ref 0.3–1.2)
Total Protein: 6.7 g/dL (ref 6.5–8.1)

## 2018-12-31 LAB — PREGNANCY, URINE: Preg Test, Ur: NEGATIVE

## 2018-12-31 LAB — URINALYSIS, MICROSCOPIC (REFLEX): Bacteria, UA: NONE SEEN

## 2018-12-31 LAB — GROUP A STREP BY PCR: Group A Strep by PCR: NOT DETECTED

## 2018-12-31 MED ORDER — ACETAMINOPHEN 325 MG PO TABS
650.0000 mg | ORAL_TABLET | Freq: Once | ORAL | Status: AC
  Administered 2018-12-31: 650 mg via ORAL
  Filled 2018-12-31: qty 2

## 2018-12-31 MED ORDER — SODIUM CHLORIDE 0.9 % IV BOLUS
1000.0000 mL | Freq: Once | INTRAVENOUS | Status: AC
  Administered 2018-12-31: 22:00:00 1000 mL via INTRAVENOUS

## 2018-12-31 MED ORDER — ONDANSETRON HCL 4 MG/2ML IJ SOLN
4.0000 mg | Freq: Once | INTRAMUSCULAR | Status: AC
  Administered 2018-12-31: 22:00:00 4 mg via INTRAVENOUS
  Filled 2018-12-31: qty 2

## 2018-12-31 NOTE — ED Provider Notes (Signed)
[facility] EMERGENCY DEPARTMENT Provider Note   CSN: [id number] Arrival date & time: 12/31/18  1922    History   Chief Complaint Chief Complaint  Patient presents with  . Fever    HPI Stacie Harding is a 23 y.o. female.     HPI Patient presents with a couple day history of sore throat fevers and chills.  Has had a cough with some mild sputum production.  States it is difficult to eat because her throat is sore.  States she has had some vomiting.  Got seen at urgent care and had COVID test done, but will not be resulted for around 5 days.  No sick contacts.  No real abdominal pain.  No dysuria. Past Medical History:  Diagnosis Date  . Hypertension     Patient Active Problem List   Diagnosis Date Noted  . LGSIL on Pap smear of cervix 09/13/2017  . Left knee injury 02/21/2012    Past Surgical History:  Procedure Laterality Date  . KNEE SURGERY       OB History    Gravida  1   Para  1   Term  1   Preterm      AB      Living  1     SAB      TAB      Ectopic      Multiple  0   Live Births  1            Home Medications    Prior to Admission medications   Medication Sig Start Date End Date Taking? Authorizing Provider  citalopram (CELEXA) 20 MG tablet Take 1 tablet (20 mg total) by mouth daily. Patient not taking: Reported on 08/21/2018 08/14/18   Levie HeritageStinson, Jacob J, DO  fluticasone [facility](FLONASE) 50 MCG/ACT nasal spray Place 2 sprays into both nostrils daily. Patient not taking: Reported on 08/14/2018 06/10/18   Belinda FisherYu, Amy V, PA-C  ibuprofen (ADVIL,MOTRIN) 800 MG tablet Take 1 tablet (800 mg total) by mouth 3 (three) times daily. Patient not taking: Reported on 10/24/2018 05/08/18   Lurline IdolMurrill, Samantha, FNP  Norethindrone Acetate-Ethinyl Estrad-FE (LOESTRIN 24 FE) 1-20 MG-MCG(24) tablet Take 1 tablet by mouth daily. 10/24/18   Levie HeritageStinson, Jacob J, DO    Family History Family History  Problem Relation Age of Onset  . Diabetes Maternal Grandmother   .  Hypertension Maternal Grandmother   . Sudden death Neg Hx   . Hyperlipidemia Neg Hx   . Heart attack Neg Hx     Social History Social History   Tobacco Use  . Smoking status: Former Smoker    Quit date: 11/2016    Years since quitting: 2.1  . Smokeless tobacco: Never Used  Substance Use Topics  . Alcohol use: No  . Drug use: No     Allergies   Iodine   Review of Systems Review of Systems  Constitutional: Positive for appetite change, fatigue and fever.  HENT: Positive for sore throat. Negative for congestion.   Respiratory: Positive for cough. Negative for shortness of breath.   Gastrointestinal: Positive for vomiting. Negative for abdominal pain.  Genitourinary: Negative for dysuria.  Musculoskeletal: Negative for back pain.  Skin: Negative for rash.  Neurological: Negative for weakness.     Physical Exam Updated Vital Signs BP 120/75 (BP Location: Right Arm)   Pulse 85   Temp 98.7 F (37.1 C) (Oral)   Resp 16   Ht 5\' 3"  (1.6 m)   Wt  54.4 kg   LMP 12/30/2018   SpO2 100%   BMI 21.26 kg/m   Physical Exam Vitals signs and nursing note reviewed.  HENT:     Head:     Comments: Posterior pharyngeal erythema without exudate.  Some tonsillar swelling.  No asymmetric swelling.    Mouth/Throat:     Mouth: Mucous membranes are moist.  Eyes:     Pupils: Pupils are equal, round, and reactive to light.  Cardiovascular:     Rate and Rhythm: Regular rhythm.  Pulmonary:     Effort: No respiratory distress.  Abdominal:     General: There is no distension.     Palpations: There is no mass.  Musculoskeletal:     Right lower leg: No edema.     Left lower leg: No edema.  Skin:    General: Skin is warm.     Capillary Refill: Capillary refill takes less than 2 seconds.  Neurological:     General: No focal deficit present.     Mental Status: She is alert.  Psychiatric:        Mood and Affect: Mood normal.      ED Treatments / Results  Labs (all labs ordered  are listed, but only abnormal results are displayed) Labs Reviewed  CBC WITH DIFFERENTIAL/PLATELET - Abnormal; Notable for the following components:      Result Value   Platelets 134 (*)    All other components within normal limits  URINALYSIS, ROUTINE W REFLEX MICROSCOPIC - Abnormal; Notable for the following components:   Hgb urine dipstick MODERATE (*)    Ketones, ur 15 (*)    All other components within normal limits  COMPREHENSIVE METABOLIC PANEL - Abnormal; Notable for the following components:   Potassium 3.2 (*)    Calcium 8.3 (*)    All other components within normal limits  GROUP A STREP BY PCR  PREGNANCY, URINE  URINALYSIS, MICROSCOPIC (REFLEX)    EKG None  Radiology Dg Chest Portable 1 View  Result Date: 12/31/2018 CLINICAL DATA:  Cough, fever and shortness of breath. Covid testing done today. EXAM: PORTABLE CHEST 1 VIEW COMPARISON:  January 16, 2009 FINDINGS: The heart size and mediastinal contours are within normal limits. Both lungs are clear. The visualized skeletal structures are unremarkable. IMPRESSION: No acute cardiopulmonary process. Electronically Signed   By: Prudencio Pair M.D.   On: 12/31/2018 22:26    Procedures Procedures (including critical care time)  Medications Ordered in ED Medications  acetaminophen (TYLENOL) tablet 650 mg (650 mg Oral Given 12/31/18 1943)  sodium chloride 0.9 % bolus 1,000 mL ( Intravenous Stopped 12/31/18 2255)  ondansetron (ZOFRAN) injection 4 mg (4 mg Intravenous Given 12/31/18 2154)     Initial Impression / Assessment and Plan / ED Course  I have reviewed the triage vital signs and the nursing notes.  Pertinent labs & imaging results that were available during my care of the patient were reviewed by me and considered in my medical decision making (see chart for details).        Patient with fever cough and sore throat.  X-ray reassuring.  Negative strep test.  Has not had outpatient COVID test.  Well-appearing.  Mild  hypokalemia can likely be supplemented with diet.  Discharge home.  Stacie Harding was evaluated in Emergency Department on 12/31/2018 for the symptoms described in the history of present illness. She was evaluated in the context of the global COVID-19 pandemic, which necessitated consideration that the patient might be  at risk for infection with the SARS-CoV-2 virus that causes COVID-19. Institutional protocols and algorithms that pertain to the evaluation of patients at risk for COVID-19 are in a state of rapid change based on information released by regulatory bodies including the CDC and federal and state organizations. These policies and algorithms were followed during the patient's care in the ED.  Final Clinical Impressions(s) / ED Diagnoses   Final diagnoses:  Viral syndrome    ED Discharge Orders    None       Benjiman CorePickering, Kahiau Schewe, MD 12/31/18 2316

## 2018-12-31 NOTE — ED Notes (Signed)
ED Provider at bedside. 

## 2018-12-31 NOTE — Discharge Instructions (Addendum)
You likely have a virus.  This could be a covert infection.  Keep yourself isolated until you have had a negative test and have been fever free for at least 24 hours and at least 10 days of symptoms.

## 2018-12-31 NOTE — ED Triage Notes (Signed)
Pt tested for covid today but states that results are going to take 5 days. Pt reports sore throat, fever and chills that started today.

## 2019-01-08 ENCOUNTER — Emergency Department (HOSPITAL_COMMUNITY): Payer: Self-pay

## 2019-01-08 ENCOUNTER — Emergency Department (HOSPITAL_COMMUNITY)
Admission: EM | Admit: 2019-01-08 | Discharge: 2019-01-09 | Disposition: A | Discharge status: Home / Self Care | Payer: Self-pay | Attending: Emergency Medicine | Admitting: Emergency Medicine

## 2019-01-08 ENCOUNTER — Encounter (HOSPITAL_COMMUNITY): Payer: Self-pay | Admitting: Emergency Medicine

## 2019-01-08 ENCOUNTER — Other Ambulatory Visit: Payer: Self-pay

## 2019-01-08 DIAGNOSIS — I10 Essential (primary) hypertension: Secondary | ICD-10-CM | POA: Insufficient documentation

## 2019-01-08 DIAGNOSIS — R55 Syncope and collapse: Secondary | ICD-10-CM | POA: Insufficient documentation

## 2019-01-08 DIAGNOSIS — M25572 Pain in left ankle and joints of left foot: Secondary | ICD-10-CM | POA: Insufficient documentation

## 2019-01-08 DIAGNOSIS — M545 Low back pain: Secondary | ICD-10-CM | POA: Insufficient documentation

## 2019-01-08 DIAGNOSIS — M25532 Pain in left wrist: Secondary | ICD-10-CM | POA: Insufficient documentation

## 2019-01-08 LAB — I-STAT BETA HCG BLOOD, ED (MC, WL, AP ONLY): I-stat hCG, quantitative: <5 m[IU]/mL (ref <5)

## 2019-01-08 MED ORDER — KETOROLAC TROMETHAMINE 60 MG/2ML IM SOLN
15.0000 mg | Freq: Once | INTRAMUSCULAR | Status: AC
  Administered 2019-01-08: 15 mg via INTRAMUSCULAR
  Filled 2019-01-08: qty 2
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
## 2019-01-08 NOTE — ED Notes (Signed)
Patient transported to CT 
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
## 2019-01-08 NOTE — ED Notes (Signed)
Attempted IV access x 2 unsuccessful, pt extremely tearful and hyperventilating. Will hold on access at this time and attempt if needed.

## 2019-01-08 NOTE — Discharge Instructions (Signed)
Take 4 over the counter ibuprofen tablets 3 times a day or 2 over-the-counter naproxen tablets twice a day for pain. Also take tylenol 1000mg (2 extra strength) four times a day.     You will hurt worse tomorrow.  That is normal.  Follow up with your family doc.  Return for trouble breathing.   Pain at the base of the thumb can sometimes be a hidden fracture.  Please follow up with the hand surgeon.

## 2019-01-09 ENCOUNTER — Encounter (HOSPITAL_BASED_OUTPATIENT_CLINIC_OR_DEPARTMENT_OTHER): Payer: Self-pay | Admitting: *Deleted

## 2019-01-09 NOTE — ED Notes (Signed)
Pt verbalized understanding of discharge instructions. Pt is now with mother waiting for a ride/change of clothes. Pt is stable and will be returning home.

## 2019-04-21 ENCOUNTER — Emergency Department (HOSPITAL_COMMUNITY): Payer: Self-pay

## 2019-04-21 ENCOUNTER — Encounter (HOSPITAL_COMMUNITY): Payer: Self-pay | Admitting: Emergency Medicine

## 2019-04-21 ENCOUNTER — Emergency Department (HOSPITAL_COMMUNITY)
Admission: EM | Admit: 2019-04-21 | Discharge: 2019-04-21 | Disposition: A | Discharge status: Home / Self Care | Payer: Self-pay | Attending: Emergency Medicine | Admitting: Emergency Medicine

## 2019-04-21 ENCOUNTER — Other Ambulatory Visit: Payer: Self-pay

## 2019-04-21 DIAGNOSIS — Y9389 Activity, other specified: Secondary | ICD-10-CM | POA: Insufficient documentation

## 2019-04-21 DIAGNOSIS — Y999 Unspecified external cause status: Secondary | ICD-10-CM | POA: Insufficient documentation

## 2019-04-21 DIAGNOSIS — W19XXXA Unspecified fall, initial encounter: Secondary | ICD-10-CM

## 2019-04-21 DIAGNOSIS — I10 Essential (primary) hypertension: Secondary | ICD-10-CM | POA: Insufficient documentation

## 2019-04-21 DIAGNOSIS — W010XXA Fall on same level from slipping, tripping and stumbling without subsequent striking against object, initial encounter: Secondary | ICD-10-CM | POA: Insufficient documentation

## 2019-04-21 DIAGNOSIS — Z3202 Encounter for pregnancy test, result negative: Secondary | ICD-10-CM | POA: Insufficient documentation

## 2019-04-21 DIAGNOSIS — S322XXA Fracture of coccyx, initial encounter for closed fracture: Secondary | ICD-10-CM | POA: Insufficient documentation

## 2019-04-21 DIAGNOSIS — Z79899 Other long term (current) drug therapy: Secondary | ICD-10-CM | POA: Insufficient documentation

## 2019-04-21 DIAGNOSIS — F17200 Nicotine dependence, unspecified, uncomplicated: Secondary | ICD-10-CM | POA: Insufficient documentation

## 2019-04-21 DIAGNOSIS — Y929 Unspecified place or not applicable: Secondary | ICD-10-CM | POA: Insufficient documentation

## 2019-04-21 LAB — PREGNANCY, URINE: Preg Test, Ur: NEGATIVE

## 2019-04-21 LAB — POC URINE PREG, ED: Preg Test, Ur: NEGATIVE

## 2019-04-21 MED ORDER — ACETAMINOPHEN 500 MG PO TABS
1000.0000 mg | ORAL_TABLET | Freq: Once | ORAL | Status: AC
  Administered 2019-04-21: 09:00:00 1000 mg via ORAL
  Filled 2019-04-21: qty 2

## 2019-04-21 NOTE — Discharge Instructions (Signed)
Use circular pillow to take pressure off coccyx.  Use tylenol, motrin and ice for pain. Gradually increase pressure and weight bearing as tolerated.  Work note will be provided for 1 week.

## 2019-04-21 NOTE — ED Triage Notes (Signed)
Pt c/o lower back pain after a fall on Friday. Ambulatory without difficulty.

## 2019-04-21 NOTE — ED Provider Notes (Signed)
[facility] EMERGENCY DEPARTMENT Provider Note   CSN: [id number] Arrival date & time: 04/21/19  0429     History   Chief Complaint Chief Complaint  Patient presents with  . Back Pain    HPI Coda Filler is a 23 y.o. female.     Patient with high blood pressure history presents with sacral tenderness since falling on Friday.  Patient slipped on the floor while playing and landed on her buttocks.  No other injuries.  No weakness or numbness in her legs.  Patient urinating okay.     Past Medical History:  Diagnosis Date  . Hypertension     Patient Active Problem List   Diagnosis Date Noted  . LGSIL on Pap smear of cervix 09/13/2017  . Left knee injury 02/21/2012    Past Surgical History:  Procedure Laterality Date  . KNEE SURGERY       OB History    Gravida  1   Para  1   Term  1   Preterm  0   AB  0   Living  1     SAB  0   TAB  0   Ectopic  0   Multiple      Live Births  1            Home Medications    Prior to Admission medications   Medication Sig Start Date End Date Taking? Authorizing Provider  citalopram (CELEXA) 20 MG tablet Take 1 tablet (20 mg total) by mouth daily. Patient not taking: Reported on 08/21/2018 08/14/18   Truett Mainland, DO  fluticasone [facility]) 50 MCG/ACT nasal spray Place 2 sprays into both nostrils daily. Patient not taking: Reported on 08/14/2018 06/10/18   Ok Edwards, PA-C  ibuprofen (ADVIL,MOTRIN) 800 MG tablet Take 1 tablet (800 mg total) by mouth 3 (three) times daily. Patient not taking: Reported on 10/24/2018 05/08/18   Enrique Sack, FNP  Norethindrone Acetate-Ethinyl Estrad-FE (LOESTRIN 24 FE) 1-20 MG-MCG(24) tablet Take 1 tablet by mouth daily. 10/24/18   Truett Mainland, DO    Family History Family History  Problem Relation Age of Onset  . Diabetes Maternal Grandmother   . Hypertension Maternal Grandmother   . Sudden death Neg Hx   . Hyperlipidemia Neg Hx   . Heart attack  Neg Hx     Social History Social History   Tobacco Use  . Smoking status: Current Every Day Smoker    Last attempt to quit: 11/2016    Years since quitting: 2.4  . Smokeless tobacco: Never Used  Substance Use Topics  . Alcohol use: Yes  . Drug use: No     Allergies   Iodine and Iodine   Review of Systems Review of Systems  Respiratory: Negative for shortness of breath.   Cardiovascular: Negative for chest pain.  Gastrointestinal: Negative for abdominal pain and vomiting.  Genitourinary: Negative for difficulty urinating and dysuria.  Musculoskeletal: Positive for back pain. Negative for neck pain.  Skin: Negative for rash.  Neurological: Negative for light-headedness and headaches.     Physical Exam Updated Vital Signs BP 118/72 (BP Location: Left Arm)   Pulse (!) 58   Temp 97.7 F (36.5 C) (Oral)   Resp 14   LMP 04/13/2019   SpO2 100%   Physical Exam Vitals signs and nursing note reviewed.  Constitutional:      Appearance: She is well-developed.  HENT:     Head: Normocephalic and atraumatic.  Eyes:     General:        Right eye: No discharge.        Left eye: No discharge.  Neck:     Musculoskeletal: Normal range of motion and neck supple.     Trachea: No tracheal deviation.  Cardiovascular:     Rate and Rhythm: Normal rate.  Pulmonary:     Effort: Pulmonary effort is normal.  Musculoskeletal:        General: Tenderness present.     Comments: Patient has moderate tenderness to lower sacrum and coccyx area.  No external bruising or step-off.  Patient has no other tenderness to major joints and has normal 5+ strength lower extremities bilateral sensation intact.  Skin:    General: Skin is warm.  Neurological:     Mental Status: She is alert and oriented to person, place, and time.      ED Treatments / Results  Labs (all labs ordered are listed, but only abnormal results are displayed) Labs Reviewed  PREGNANCY, URINE  POC URINE PREG, ED     EKG None  Radiology No results found.  Procedures Procedures (including critical care time)  Medications Ordered in ED Medications  acetaminophen (TYLENOL) tablet 1,000 mg (1,000 mg Oral Given 04/21/19 0855)     Initial Impression / Assessment and Plan / ED Course  I have reviewed the triage vital signs and the nursing notes.  Pertinent labs & imaging results that were available during my care of the patient were reviewed by me and considered in my medical decision making (see chart for details).       Patient presents with clinical concern for coccyx fracture or contusion.  Discussed x-ray to confirm if fracture so patient is aware she understands it is not surgical.  Discussed circular pillow and supportive care.  Pain meds given. Preg neg reviewed urine. X-ray reviewed concern for occult coccyx fracture.  Work note given supportive care discussed.   Final Clinical Impressions(s) / ED Diagnoses   Final diagnoses:  Fracture of coccyx, initial encounter for closed fracture [facility])    ED Discharge Orders    None       Blane Ohara, MD 04/21/19 [id number]

## 2019-04-21 NOTE — ED Notes (Signed)
Pt to x-ray with transporter .

## 2019-09-22 ENCOUNTER — Encounter: Payer: Medicaid Other | Admitting: Advanced Practice Midwife

## 2019-11-27 ENCOUNTER — Inpatient Hospital Stay (HOSPITAL_COMMUNITY): Payer: Medicaid Other

## 2019-11-27 ENCOUNTER — Encounter (HOSPITAL_COMMUNITY): Payer: Self-pay | Admitting: Obstetrics & Gynecology

## 2019-11-27 ENCOUNTER — Inpatient Hospital Stay (HOSPITAL_COMMUNITY)
Admission: AD | Admit: 2019-11-27 | Discharge: 2019-11-27 | Disposition: A | Discharge status: Home / Self Care | Payer: Medicaid Other | Attending: Obstetrics & Gynecology | Admitting: Obstetrics & Gynecology

## 2019-11-27 ENCOUNTER — Other Ambulatory Visit: Payer: Self-pay

## 2019-11-27 DIAGNOSIS — O219 Vomiting of pregnancy, unspecified: Secondary | ICD-10-CM

## 2019-11-27 DIAGNOSIS — O10011 Pre-existing essential hypertension complicating pregnancy, first trimester: Secondary | ICD-10-CM | POA: Insufficient documentation

## 2019-11-27 DIAGNOSIS — Z79899 Other long term (current) drug therapy: Secondary | ICD-10-CM | POA: Diagnosis not present

## 2019-11-27 DIAGNOSIS — R109 Unspecified abdominal pain: Secondary | ICD-10-CM

## 2019-11-27 DIAGNOSIS — O26891 Other specified pregnancy related conditions, first trimester: Secondary | ICD-10-CM

## 2019-11-27 DIAGNOSIS — Z87891 Personal history of nicotine dependence: Secondary | ICD-10-CM | POA: Diagnosis not present

## 2019-11-27 DIAGNOSIS — Z3491 Encounter for supervision of normal pregnancy, unspecified, first trimester: Secondary | ICD-10-CM

## 2019-11-27 DIAGNOSIS — Z3A01 Less than 8 weeks gestation of pregnancy: Secondary | ICD-10-CM | POA: Diagnosis not present

## 2019-11-27 LAB — WET PREP, GENITAL
Clue Cells Wet Prep HPF POC: NONE SEEN
Sperm: NONE SEEN
Trich, Wet Prep: NONE SEEN
Yeast Wet Prep HPF POC: NONE SEEN

## 2019-11-27 LAB — URINALYSIS, ROUTINE W REFLEX MICROSCOPIC
Bilirubin Urine: NEGATIVE
Glucose, UA: NEGATIVE mg/dL
Hgb urine dipstick: NEGATIVE
Ketones, ur: NEGATIVE mg/dL
Leukocytes,Ua: NEGATIVE
Nitrite: NEGATIVE
Protein, ur: NEGATIVE mg/dL
Specific Gravity, Urine: 1.023 (ref 1.005–1.030)
pH: 6 (ref 5.0–8.0)

## 2019-11-27 MED ORDER — METOCLOPRAMIDE HCL 10 MG PO TABS: 10.0000 mg | ORAL_TABLET | Freq: Three times a day (TID) | ORAL | 0 refills | Status: DC | PRN

## 2019-11-27 NOTE — MAU Note (Signed)
Is 7 wks preg.  The last 3 days, has been cramping bad, esp on left side. Pain is worse at night.  Has been having a hard time sleeping due to restless legs and cramping.  Nausea is really bad too. +HPT, no confirmation.

## 2019-11-27 NOTE — Discharge Instructions (Signed)
Return to care   If you have heavier bleeding that soaks through more that 2 pads per hour for an hour or more  If you bleed so much that you feel like you might pass out or you do pass out  If you have significant abdominal pain that is not improved with Tylenol       Morning Sickness  Morning sickness is when a woman feels nauseous during pregnancy. This nauseous feeling may or may not come with vomiting. It often occurs in the morning, but it can be a problem at any time of day. Morning sickness is most common during the first trimester. In some cases, it may continue throughout pregnancy. Although morning sickness is unpleasant, it is usually harmless unless the woman develops severe and continual vomiting (hyperemesis gravidarum), a condition that requires more intense treatment. What are the causes? The exact cause of this condition is not known, but it seems to be related to normal hormonal changes that occur in pregnancy. What increases the risk? You are more likely to develop this condition if:  You experienced nausea or vomiting before your pregnancy.  You had morning sickness during a previous pregnancy.  You are pregnant with more than one baby, such as twins. What are the signs or symptoms? Symptoms of this condition include:  Nausea.  Vomiting. How is this diagnosed? This condition is usually diagnosed based on your signs and symptoms. How is this treated? In many cases, treatment is not needed for this condition. Making some changes to what you eat may help to control symptoms. Your health care provider may also prescribe or recommend:  Vitamin B6 supplements.  Anti-nausea medicines.  Ginger. Follow these instructions at home: Medicines  Take over-the-counter and prescription medicines only as told by your health care provider. Do not use any prescription, over-the-counter, or herbal medicines for morning sickness without first talking with your health care  provider.  Taking multivitamins before getting pregnant can prevent or decrease the severity of morning sickness in most women. Eating and drinking  Eat a piece of dry toast or crackers before getting out of bed in the morning.  Eat 5 or 6 small meals a day.  Eat dry and bland foods, such as rice or a baked potato. Foods that are high in carbohydrates are often helpful.  Avoid greasy, fatty, and spicy foods.  Have someone cook for you if the smell of any food causes nausea and vomiting.  If you feel nauseous after taking prenatal vitamins, take the vitamins at night or with a snack.  Snack on protein foods between meals if you are hungry. Nuts, yogurt, and cheese are good options.  Drink fluids throughout the day.  Try ginger ale made with real ginger, ginger tea made from fresh grated ginger, or ginger candies. General instructions  Do not use any products that contain nicotine or tobacco, such as cigarettes and e-cigarettes. If you need help quitting, ask your health care provider.  Get an air purifier to keep the air in your house free of odors.  Get plenty of fresh air.  Try to avoid odors that trigger your nausea.  Consider trying these methods to help relieve symptoms: ? Wearing an acupressure wristband. These wristbands are often worn for seasickness. ? Acupuncture. Contact a health care provider if:  Your home remedies are not working and you need medicine.  You feel dizzy or light-headed.  You are losing weight. Get help right away if:  You have persistent   and uncontrolled nausea and vomiting.  You faint.  You have severe pain in your abdomen. Summary  Morning sickness is when a woman feels nauseous during pregnancy. This nauseous feeling may or may not come with vomiting.  Morning sickness is most common during the first trimester.  It often occurs in the morning, but it can be a problem at any time of day.  In many cases, treatment is not needed for  this condition. Making some changes to what you eat may help to control symptoms. This information is not intended to replace advice given to you by your health care provider. Make sure you discuss any questions you have with your health care provider. Document Revised: 05/03/2017 Document Reviewed: 06/23/2016 Elsevier Patient Education  2020 [employer]  

## 2019-11-27 NOTE — MAU Provider Note (Signed)
Chief Complaint: Nausea, Abdominal Pain, and Possible Pregnancy   First Provider Initiated Contact with Patient 11/27/19 1942     SUBJECTIVE HPI: Stacie Harding is a 24 y.o. G2P1001 at [redacted]w[redacted]d who presents to Maternity Admissions reporting abdominal pain. Symptoms started 3 days ago. Reports intermittent cramping in lower abdomen. Has vomited 3 times per day; does not have antiemetic at home. Denies diarrhea, constipation, dysuria, vaginal discharge, or vaginal bleeding.   Location: abdomen Quality: cramping Severity: 4/10 on pain scale Duration: 3 days Timing: intermittent Modifying factors: none Associated signs and symptoms: n/v  Past Medical History:  Diagnosis Date  . Hypertension    OB History  Gravida Para Term Preterm AB Living  2 1 1  0 0 1  SAB TAB Ectopic Multiple Live Births  0 0 0   1    # Outcome Date GA Lbr Len/2nd Weight Sex Delivery Anes PTL Lv  2 Current           1 Term 08/05/17 [redacted]w[redacted]d 06:34 / 00:19 3655 g M Vag-Spont EPI  LIV   Past Surgical History:  Procedure Laterality Date  . KNEE SURGERY     Social History   Socioeconomic History  . Marital status: Single    Spouse name: Not on file  . Number of children: Not on file  . Years of education: Not on file  . Highest education level: Not on file  Occupational History  . Not on file  Tobacco Use  . Smoking status: Former Smoker    Quit date: 11/2016    Years since quitting: 3.0  . Smokeless tobacco: Never Used  Vaping Use  . Vaping Use: Never used  Substance and Sexual Activity  . Alcohol use: Not Currently  . Drug use: No  . Sexual activity: Yes    Birth control/protection: Implant  Other Topics Concern  . Not on file  Social History Narrative   ** Merged History Encounter **       Social Determinants of Health   Financial Resource Strain:   . Difficulty of Paying Living Expenses:   Food Insecurity:   . Worried About 12/2016 in the Last Year:   . Programme researcher, broadcasting/film/video in the Last  Year:   Transportation Needs:   . Barista (Medical):   Freight forwarder Lack of Transportation (Non-Medical):   Physical Activity:   . Days of Exercise per Week:   . Minutes of Exercise per Session:   Stress:   . Feeling of Stress :   Social Connections:   . Frequency of Communication with Friends and Family:   . Frequency of Social Gatherings with Friends and Family:   . Attends Religious Services:   . Active Member of Clubs or Organizations:   . Attends [employer] Meetings:   Banker Marital Status:   Intimate Partner Violence:   . Fear of Current or Ex-Partner:   . Emotionally Abused:   [employer] Physically Abused:   . Sexually Abused:    Family History  Problem Relation Age of Onset  . Diabetes Maternal Grandmother   . Hypertension Maternal Grandmother   . Sudden death Neg Hx   . Hyperlipidemia Neg Hx   . Heart attack Neg Hx    No current facility-administered medications on file prior to encounter.   Current Outpatient Medications on File Prior to Encounter  Medication Sig Dispense Refill  . ibuprofen (ADVIL,MOTRIN) 800 MG tablet Take 1 tablet (800 mg total) by mouth 3 (  three) times daily. 21 tablet 0  . citalopram (CELEXA) 20 MG tablet Take 1 tablet (20 mg total) by mouth daily. (Patient not taking: Reported on 08/21/2018) 30 tablet 12  . fluticasone (FLONASE) 50 MCG/ACT nasal spray Place 2 sprays into both nostrils daily. (Patient not taking: Reported on 08/14/2018) 1 g 0  . Norethindrone Acetate-Ethinyl Estrad-FE (LOESTRIN 24 FE) 1-20 MG-MCG(24) tablet Take 1 tablet by mouth daily. 3 Package 3   Allergies  Allergen Reactions  . Iodine Nausea And Vomiting and Swelling    I have reviewed patient's Past Medical Hx, Surgical Hx, Family Hx, Social Hx, medications and allergies.   Review of Systems  Constitutional: Negative.   Gastrointestinal: Positive for abdominal pain, nausea and vomiting. Negative for constipation and diarrhea.  Genitourinary: Negative.      OBJECTIVE Patient Vitals for the past 24 hrs:  BP Temp Temp src Pulse Resp SpO2 Height Weight  11/27/19 1849 118/66 98.3 F (36.8 C) Oral 72 16 100 % 5\' 2"  (1.575 m) 51.1 kg   Constitutional: Well-developed, well-nourished female in no acute distress.  Cardiovascular: normal rate & rhythm, no murmur Respiratory: normal rate and effort. Lung sounds clear throughout GI: Abd soft, non-tender, Pos BS x 4. No guarding or rebound tenderness MS: Extremities nontender, no edema, normal ROM Neurologic: Alert and oriented x 4.     LAB RESULTS Results for orders placed or performed during the hospital encounter of 11/27/19 (from the past 24 hour(s))  Urinalysis, Routine w reflex microscopic     Status: None   Collection Time: 11/27/19  6:51 PM  Result Value Ref Range   Color, Urine YELLOW YELLOW   APPearance CLEAR CLEAR   Specific Gravity, Urine 1.023 1.005 - 1.030   pH 6.0 5.0 - 8.0   Glucose, UA NEGATIVE NEGATIVE mg/dL   Hgb urine dipstick NEGATIVE NEGATIVE   Bilirubin Urine NEGATIVE NEGATIVE   Ketones, ur NEGATIVE NEGATIVE mg/dL   Protein, ur NEGATIVE NEGATIVE mg/dL   Nitrite NEGATIVE NEGATIVE   Leukocytes,Ua NEGATIVE NEGATIVE  Wet prep, genital     Status: Abnormal   Collection Time: 11/27/19  8:04 PM  Result Value Ref Range   Yeast Wet Prep HPF POC NONE SEEN NONE SEEN   Trich, Wet Prep NONE SEEN NONE SEEN   Clue Cells Wet Prep HPF POC NONE SEEN NONE SEEN   WBC, Wet Prep HPF POC FEW (A) NONE SEEN   Sperm NONE SEEN     IMAGING US OB Comp Less 14 Wks  Result Date: 11/27/2019 CLINICAL DATA:  Abdominal pain in first trimester of pregnancy; LMP 10/08/2019, pending quantitative beta HCG EXAM: OBSTETRIC <14 WK ULTRASOUND TECHNIQUE: Transabdominal ultrasound was performed for evaluation of the gestation as well as the maternal uterus and adnexal regions. Transvaginal imaging was not performed. COMPARISON:  None for this gestation FINDINGS: Intrauterine gestational sac: Present,  single Yolk sac:  Present Embryo:  Present Cardiac Activity: Present Heart Rate: 135 bpm CRL:   8.3 mm   6 w 5 d                  [place] EDC: 07/17/2020 Subchorionic hemorrhage:  None visualized. Maternal uterus/adnexae: Maternal uterus otherwise unremarkable. LEFT ovary measures 2.8 x 2.0 x 2.4 cm and contains a small corpus luteum. RIGHT ovary normal size and morphology 3.0 x 1.3 x 1.6 cm. No free pelvic fluid or adnexal masses. IMPRESSION: Single live intrauterine gestation at 6 weeks 5 days EGA by crown-rump length. No acute abnormalities. Electronically  Signed   By: Ulyses Southward M.D.   On: 11/27/2019 20:33    MAU COURSE Orders Placed This Encounter  Procedures  . Wet prep, genital  . US OB Comp Less 14 Wks  . Urinalysis, Routine w reflex microscopic  . Discharge patient   Meds ordered this encounter  Medications  . metoCLOPramide (REGLAN) 10 MG tablet    Sig: Take 1 tablet (10 mg total) by mouth every 8 (eight) hours as needed for nausea.    Dispense:  30 tablet    Refill:  0    Order Specific Question:   Supervising Provider    Answer:   Jaynie Collins A [[id number]]    MDM +UPT UA, wet prep, GC/chlamydia, CBC, ABO/Rh, quant hCG, and [place] today to rule out ectopic pregnancy which can be life threatening.   Ultrasound shows live IUP measuring [redacted]w[redacted]d c/w LMP dating  Wet prep negative U/a negative  Pt not nauseated at this time but requesting rx for antiemetic at home  ASSESSMENT 1. Normal IUP (intrauterine pregnancy) on prenatal ultrasound, first trimester   2. Abdominal pain during pregnancy in first trimester   3. [redacted] weeks gestation of pregnancy   4. Nausea and vomiting during pregnancy prior to [redacted] weeks gestation     PLAN Discharge home in stable condition. Rx reglan Discussed reasons to return to MAU GC/CT pending   Allergies as of 11/27/2019      Reactions   Iodine Nausea And Vomiting, Swelling      Medication List    STOP taking these medications   citalopram 20 MG  tablet Commonly known as: CeleXA   fluticasone 50 MCG/ACT nasal spray Commonly known as: FLONASE   ibuprofen 800 MG tablet Commonly known as: ADVIL   Norethindrone Acetate-Ethinyl Estrad-FE 1-20 MG-MCG(24) tablet Commonly known as: LOESTRIN 24 FE     TAKE these medications   metoCLOPramide 10 MG tablet Commonly known as: REGLAN Take 1 tablet (10 mg total) by mouth every 8 (eight) hours as needed for nausea.        Judeth Horn, NP 11/27/2019  9:14 PM

## 2019-11-30 LAB — POCT PREGNANCY, URINE: Preg Test, Ur: POSITIVE — AB

## 2019-11-30 LAB — GC/CHLAMYDIA PROBE AMP (~~LOC~~) NOT AT ARMC
Chlamydia: NEGATIVE
Comment: NEGATIVE
Comment: NORMAL
Neisseria Gonorrhea: NEGATIVE

## 2019-12-08 ENCOUNTER — Other Ambulatory Visit: Payer: Self-pay

## 2019-12-08 ENCOUNTER — Ambulatory Visit (INDEPENDENT_AMBULATORY_CARE_PROVIDER_SITE_OTHER): Payer: Self-pay | Admitting: Advanced Practice Midwife

## 2019-12-08 ENCOUNTER — Encounter: Payer: Self-pay | Admitting: Advanced Practice Midwife

## 2019-12-08 ENCOUNTER — Other Ambulatory Visit (HOSPITAL_COMMUNITY)
Admission: RE | Admit: 2019-12-08 | Discharge: 2019-12-08 | Disposition: A | Discharge status: Home / Self Care | Payer: Medicaid Other | Source: Ambulatory Visit | Attending: Advanced Practice Midwife | Admitting: Advanced Practice Midwife

## 2019-12-08 DIAGNOSIS — O10919 Unspecified pre-existing hypertension complicating pregnancy, unspecified trimester: Secondary | ICD-10-CM | POA: Diagnosis not present

## 2019-12-08 DIAGNOSIS — Z3A08 8 weeks gestation of pregnancy: Secondary | ICD-10-CM

## 2019-12-08 DIAGNOSIS — O99211 Obesity complicating pregnancy, first trimester: Secondary | ICD-10-CM

## 2019-12-08 DIAGNOSIS — I10 Essential (primary) hypertension: Secondary | ICD-10-CM

## 2019-12-08 DIAGNOSIS — Z3480 Encounter for supervision of other normal pregnancy, unspecified trimester: Secondary | ICD-10-CM | POA: Diagnosis present

## 2019-12-08 MED ORDER — DOXYLAMINE-PYRIDOXINE 10-10 MG PO TBEC: 2.0000 | DELAYED_RELEASE_TABLET | Freq: Every evening | ORAL | 5 refills | Status: DC | PRN

## 2019-12-08 MED ORDER — PRENATAL VITAMIN 27-0.8 MG PO TABS: 1.0000 | ORAL_TABLET | Freq: Every day | ORAL | 12 refills | Status: DC

## 2019-12-08 NOTE — Patient Instructions (Signed)
First Trimester of Pregnancy  The first trimester of pregnancy is from week 1 until the end of week 13 (months 1 through 3). During this time, your baby will begin to develop inside you. At 6-8 weeks, the eyes and face are formed, and the heartbeat can be seen on ultrasound. At the end of 12 weeks, all the baby's organs are formed. Prenatal care is all the medical care you receive before the birth of your baby. Make sure you get good prenatal care and follow all of your doctor's instructions. Follow these instructions at home: Medicines  Take over-the-counter and prescription medicines only as told by your doctor. Some medicines are safe and some medicines are not safe during pregnancy.  Take a prenatal vitamin that contains at least 600 micrograms (mcg) of folic acid.  If you have trouble pooping (constipation), take medicine that will make your stool soft (stool softener) if your doctor approves. Eating and drinking   Eat regular, healthy meals.  Your doctor will tell you the amount of weight gain that is right for you.  Avoid raw meat and uncooked cheese.  If you feel sick to your stomach (nauseous) or throw up (vomit): ? Eat 4 or 5 small meals a day instead of 3 large meals. ? Try eating a few soda crackers. ? Drink liquids between meals instead of during meals.  To prevent constipation: ? Eat foods that are high in fiber, like fresh fruits and vegetables, whole grains, and beans. ? Drink enough fluids to keep your pee (urine) clear or pale yellow. Activity  Exercise only as told by your doctor. Stop exercising if you have cramps or pain in your lower belly (abdomen) or low back.  Do not exercise if it is too hot, too humid, or if you are in a place of great height (high altitude).  Try to avoid standing for long periods of time. Move your legs often if you must stand in one place for a long time.  Avoid heavy lifting.  Wear low-heeled shoes. Sit and stand up  straight.  You can have sex unless your doctor tells you not to. Relieving pain and discomfort  Wear a good support bra if your breasts are sore.  Take warm water baths (sitz baths) to soothe pain or discomfort caused by hemorrhoids. Use hemorrhoid cream if your doctor says it is okay.  Rest with your legs raised if you have leg cramps or low back pain.  If you have puffy, bulging veins (varicose veins) in your legs: ? Wear support hose or compression stockings as told by your doctor. ? Raise (elevate) your feet for 15 minutes, 3-4 times a day. ? Limit salt in your food. Prenatal care  Schedule your prenatal visits by the twelfth week of pregnancy.  Write down your questions. Take them to your prenatal visits.  Keep all your prenatal visits as told by your doctor. This is important. Safety  Wear your seat belt at all times when driving.  Make a list of emergency phone numbers. The list should include numbers for family, friends, the hospital, and police and fire departments. General instructions  Ask your doctor for a referral to a local prenatal class. Begin classes no later than at the start of month 6 of your pregnancy.  Ask for help if you need counseling or if you need help with nutrition. Your doctor can give you advice or tell you where to go for help.  Do not use hot tubs, steam   rooms, or saunas.  Do not douche or use tampons or scented sanitary pads.  Do not cross your legs for long periods of time.  Avoid all herbs and alcohol. Avoid drugs that are not approved by your doctor.  Do not use any tobacco products, including cigarettes, chewing tobacco, and electronic cigarettes. If you need help quitting, ask your doctor. You may get counseling or other support to help you quit.  Avoid cat litter boxes and soil used by cats. These carry germs that can cause birth defects in the baby and can cause a loss of your baby (miscarriage) or stillbirth.  Visit your dentist.  At home, brush your teeth with a soft toothbrush. Be gentle when you floss. Contact a doctor if:  You are dizzy.  You have mild cramps or pressure in your lower belly.  You have a nagging pain in your belly area.  You continue to feel sick to your stomach, you throw up, or you have watery poop (diarrhea).  You have a bad smelling fluid coming from your vagina.  You have pain when you pee (urinate).  You have increased puffiness (swelling) in your face, hands, legs, or ankles. Get help right away if:  You have a fever.  You are leaking fluid from your vagina.  You have spotting or bleeding from your vagina.  You have very bad belly cramping or pain.  You gain or lose weight rapidly.  You throw up blood. It may look like coffee grounds.  You are around people who have German measles, fifth disease, or chickenpox.  You have a very bad headache.  You have shortness of breath.  You have any kind of trauma, such as from a fall or a car accident. Summary  The first trimester of pregnancy is from week 1 until the end of week 13 (months 1 through 3).  To take care of yourself and your unborn baby, you will need to eat healthy meals, take medicines only if your doctor tells you to do so, and do activities that are safe for you and your baby.  Keep all follow-up visits as told by your doctor. This is important as your doctor will have to ensure that your baby is healthy and growing well. This information is not intended to replace advice given to you by your health care provider. Make sure you discuss any questions you have with your health care provider. Document Revised: 09/11/2018 Document Reviewed: 05/29/2016 Elsevier Patient Education  2020 Elsevier Inc.  

## 2019-12-08 NOTE — Progress Notes (Signed)
°  Subjective:    Stacie Harding is a G2P1001 [redacted]w[redacted]d being seen today for her first obstetrical visit.  Her obstetrical history is significant for obesity and Chronic Hypertension (had persistent HTN months after delivery). Patient does intend to breast feed. Pregnancy history fully reviewed.  Patient reports nausea.  Vitals:   12/08/19 0914  BP: (!) 107/51  Pulse: 67  Weight: 116 lb (52.6 kg)    HISTORY: OB History  Gravida Para Term Preterm AB Living  2 1 1  0 0 1  SAB TAB Ectopic Multiple Live Births  0 0 0   1    # Outcome Date GA Lbr Len/2nd Weight Sex Delivery Anes PTL Lv  2 Current           1 Term 08/05/17 [redacted]w[redacted]d 06:34 / 00:19 8 lb 0.9 oz (3.655 kg) M Vag-Spont EPI  LIV   Past Medical History:  Diagnosis Date   Hypertension    Past Surgical History:  Procedure Laterality Date   KNEE SURGERY     Family History  Problem Relation Age of Onset   Diabetes Maternal Grandmother    Hypertension Maternal Grandmother    Sudden death Neg Hx    Hyperlipidemia Neg Hx    Heart attack Neg Hx      Exam    Uterus:     Pelvic Exam:    Perineum: Normal Perineum   Vulva: Bartholin's, Urethra, Skene's normal   Vagina:  normal discharge   pH:    Cervix: no cervical motion tenderness   Adnexa: no mass, fullness, tenderness   Bony Pelvis: gynecoid  System: Breast:  normal appearance, no masses or tenderness   Skin: normal coloration and turgor, no rashes    Neurologic: oriented, grossly non-focal   Extremities: normal strength, tone, and muscle mass   HEENT neck supple with midline trachea   Mouth/Teeth mucous membranes moist, pharynx normal without lesions   Neck supple and no masses   Cardiovascular: regular rate and rhythm   Respiratory:  appears well, vitals normal, no respiratory distress, acyanotic, normal RR, ear and throat exam is normal, neck free of mass or lymphadenopathy, chest clear, no wheezing, crepitations, rhonchi, normal symmetric air entry   Abdomen:  soft, non-tender; bowel sounds normal; no masses,  no organomegaly   Urinary: urethral meatus normal      Assessment:    Pregnancy: G2P1001 Patient Active Problem List   Diagnosis Date Noted   Chronic hypertension affecting pregnancy 12/08/2019   Supervision of other normal pregnancy, antepartum 12/08/2019   LGSIL on Pap smear of cervix 09/13/2017   Left knee injury 02/21/2012        Plan:    Welcomed to practice. Routines reviewed  Initial labs drawn. Continue prenatal vitamins. Discussed and offered genetic screening options, including Quad screen/AFP, NIPS testing, and option to decline testing. Benefits/risks/alternatives reviewed. Pt aware that anatomy 02/23/2012 is form of genetic screening with lower accuracy in detecting trisomies than blood work.  [place] Ultrasound discussed; fetal anatomic survey: ordered. Problem list reviewed and updated.  Discussed recommendation to start baby ASA after 12 weeks  The nature of [facility] with multiple MDs and other Advanced Practice Providers was explained to patient; also emphasized that residents, students are part of our team. Routine obstetric precautions reviewed.   Follow up in 4 weeks. 50% of 30 min visit spent on counseling and coordination of care.     [facility] 12/08/2019

## 2019-12-09 LAB — CYTOLOGY - PAP: Diagnosis: NEGATIVE

## 2019-12-10 ENCOUNTER — Telehealth: Payer: Self-pay

## 2019-12-10 DIAGNOSIS — B379 Candidiasis, unspecified: Secondary | ICD-10-CM

## 2019-12-10 LAB — URINE CULTURE, OB REFLEX

## 2019-12-10 LAB — CULTURE, OB URINE

## 2019-12-10 MED ORDER — TERCONAZOLE 0.4 % VA CREA: TOPICAL_CREAM | VAGINAL | 0 refills | Status: DC

## 2019-12-10 NOTE — Telephone Encounter (Signed)
Called pt to discuss Pap smear results. Pt made aware that her Pap smear was normal but she  has a yeast infection. Terconazole was sent to pt's pharmacy. Understanding was voiced. Cielle Aguila l Charvis Lightner, CMA

## 2019-12-16 ENCOUNTER — Encounter: Payer: Self-pay | Admitting: Advanced Practice Midwife

## 2019-12-16 DIAGNOSIS — O285 Abnormal chromosomal and genetic finding on antenatal screening of mother: Secondary | ICD-10-CM | POA: Insufficient documentation

## 2019-12-16 LAB — SMN1 COPY NUMBER ANALYSIS (SMA CARRIER SCREENING)

## 2019-12-16 LAB — HEMOGLOBPATHY+FER W/A THAL RFX
Ferritin: 66 ng/mL (ref 15–150)
Hgb A2: 2.7 % (ref 1.8–3.2)
Hgb A: 97.3 % (ref 96.4–98.8)
Hgb F: 0 % (ref 0.0–2.0)
Hgb S: 0 %

## 2019-12-16 LAB — CBC/D/PLT+RPR+RH+ABO+RUB AB...
Antibody Screen: NEGATIVE
Basophils Absolute: 0 10*3/uL (ref 0.0–0.2)
Basos: 1 %
EOS (ABSOLUTE): 0 10*3/uL (ref 0.0–0.4)
Eos: 1 %
HCV Ab: <0.1 s/co ratio (ref 0.0–0.9)
HIV Screen 4th Generation wRfx: NONREACTIVE
Hematocrit: 35.5 % (ref 34.0–46.6)
Hemoglobin: 11.7 g/dL (ref 11.1–15.9)
Hepatitis B Surface Ag: NEGATIVE
Immature Grans (Abs): 0 10*3/uL (ref 0.0–0.1)
Immature Granulocytes: 0 %
Lymphocytes Absolute: 0.9 10*3/uL (ref 0.7–3.1)
Lymphs: 26 %
MCH: 31.9 pg (ref 26.6–33.0)
MCHC: 33 g/dL (ref 31.5–35.7)
MCV: 97 fL (ref 79–97)
Monocytes Absolute: 0.2 10*3/uL (ref 0.1–0.9)
Monocytes: 6 %
Neutrophils Absolute: 2.4 10*3/uL (ref 1.4–7.0)
Neutrophils: 66 %
Platelets: 145 10*3/uL — ABNORMAL LOW (ref 150–450)
RBC: 3.67 x10E6/uL — ABNORMAL LOW (ref 3.77–5.28)
RDW: 12.2 % (ref 11.7–15.4)
RPR Ser Ql: NONREACTIVE
Rh Factor: POSITIVE
Rubella Antibodies, IGG: 11.6 index (ref >0.99)
WBC: 3.5 10*3/uL (ref 3.4–10.8)

## 2019-12-16 LAB — HCV INTERPRETATION

## 2019-12-17 ENCOUNTER — Telehealth: Payer: Self-pay

## 2019-12-17 DIAGNOSIS — O285 Abnormal chromosomal and genetic finding on antenatal screening of mother: Secondary | ICD-10-CM

## 2019-12-17 NOTE — Telephone Encounter (Signed)
Referral placed per Wynelle Bourgeois, CNM

## 2019-12-21 ENCOUNTER — Other Ambulatory Visit: Payer: Self-pay

## 2019-12-21 DIAGNOSIS — O0990 Supervision of high risk pregnancy, unspecified, unspecified trimester: Secondary | ICD-10-CM

## 2020-01-05 ENCOUNTER — Ambulatory Visit (INDEPENDENT_AMBULATORY_CARE_PROVIDER_SITE_OTHER): Payer: Self-pay | Admitting: Advanced Practice Midwife

## 2020-01-05 ENCOUNTER — Other Ambulatory Visit: Payer: Self-pay

## 2020-01-05 VITALS — BP 122/60 | HR 76 | Wt 115.0 lb

## 2020-01-05 DIAGNOSIS — O10919 Unspecified pre-existing hypertension complicating pregnancy, unspecified trimester: Secondary | ICD-10-CM

## 2020-01-05 DIAGNOSIS — Z148 Genetic carrier of other disease: Secondary | ICD-10-CM

## 2020-01-05 DIAGNOSIS — O219 Vomiting of pregnancy, unspecified: Secondary | ICD-10-CM

## 2020-01-05 DIAGNOSIS — O0990 Supervision of high risk pregnancy, unspecified, unspecified trimester: Secondary | ICD-10-CM

## 2020-01-05 MED ORDER — DOXYLAMINE-PYRIDOXINE 10-10 MG PO TBEC: 2.0000 | DELAYED_RELEASE_TABLET | Freq: Every evening | ORAL | 5 refills | Status: DC | PRN

## 2020-01-05 MED ORDER — ONDANSETRON 4 MG PO TBDP
4.0000 mg | ORAL_TABLET | Freq: Four times a day (QID) | ORAL | 0 refills | Status: DC | PRN
Start: 2020-01-05 — End: 2020-04-27

## 2020-01-05 NOTE — Patient Instructions (Signed)

## 2020-01-05 NOTE — Progress Notes (Signed)
° °  PRENATAL VISIT NOTE  Subjective:  Stacie Harding is a 24 y.o. G2P1001 at [redacted]w[redacted]d being seen today for ongoing prenatal care.  She is currently monitored for the following issues for this high-risk pregnancy and has Left knee injury; LGSIL on Pap smear of cervix; Chronic hypertension affecting pregnancy; Supervision of other normal pregnancy, antepartum; Abnormal genetic test during pregnancy; and Genetic carrier on their problem list.  Patient reports nausea.  Contractions: Not present. Vag. Bleeding: None.  Movement: Absent. Denies leaking of fluid.   The following portions of the patient's history were reviewed and updated as appropriate: allergies, current medications, past family history, past medical history, past social history, past surgical history and problem list.   Objective:   Vitals:   01/05/20 1039  BP: 122/60  Pulse: 76  Weight: 115 lb (52.2 kg)    Fetal Status: Fetal Heart Rate (bpm): 155   Movement: Absent     General:  Alert, oriented and cooperative. Patient is in no acute distress.  Skin: Skin is warm and dry. No rash noted.   Cardiovascular: Normal heart rate noted  Respiratory: Normal respiratory effort, no problems with respiration noted  Abdomen: Soft, gravid, appropriate for gestational age.  Pain/Pressure: Present     Pelvic: Cervical exam deferred        Extremities: Normal range of motion.  Edema: None  Mental Status: Normal mood and affect. Normal behavior. Normal judgment and thought content.   Assessment and Plan:  Pregnancy: G2P1001 at [redacted]w[redacted]d 1. Genetic carrier     Referred to Dentist for SMA carrier  2. Supervision of high risk pregnancy, antepartum     Scheduled for anatomy US  3. Chronic hypertension affecting pregnancy    Normotensive now  4. Nausea and vomiting during pregnancy prior to [redacted] weeks gestation    Rx Diclegis and may use Zofran if diclegis fails  Preterm labor symptoms and general obstetric precautions including but  not limited to vaginal bleeding, contractions, leaking of fluid and fetal movement were reviewed in detail with the patient. Please refer to After Visit Summary for other counseling recommendations.    Future Appointments  Date Time Provider Department Center  02/04/2020 10:30 AM Levie Heritage, DO [facility] None  02/23/2020  1:15 PM [facility] NURSE [facility]  02/23/2020  1:30 PM [facility] US3 [facility]  02/23/2020  2:30 PM [facility] GENETIC COUNSELING RM [facility]    Wynelle Bourgeois, CNM

## 2020-01-15 IMAGING — DX LUMBAR SPINE - COMPLETE 4+ VIEW
5 series · 5 of 5 positions shown · non-contrast
Comparison: None.

CLINICAL DATA: Low back pain

EXAM:
LUMBAR SPINE - COMPLETE 4+ VIEW

[t lumbar spine ap]
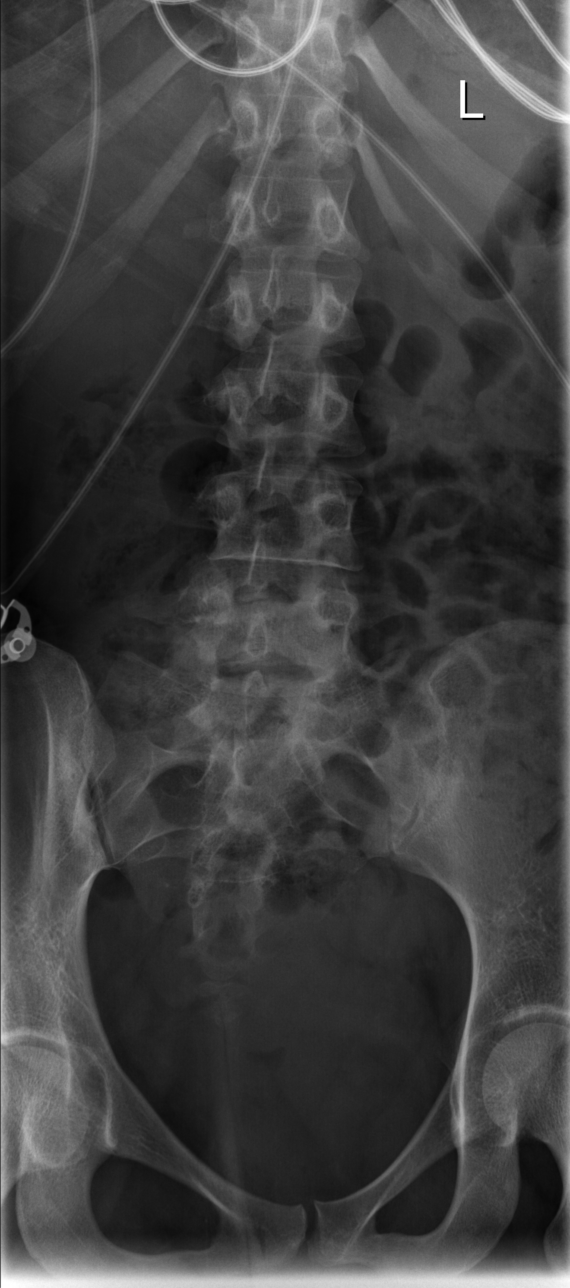

[t lumbar spine obl (1 of 2)]
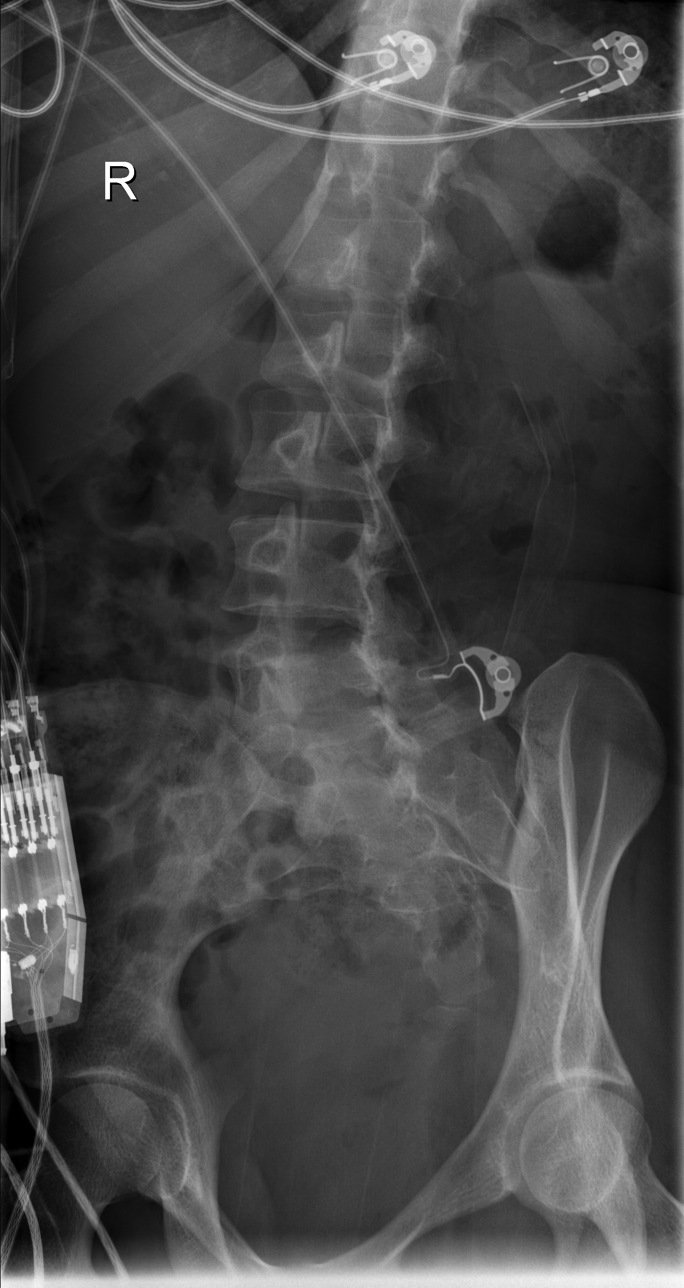

[t lumbar spine obl (2 of 2)]
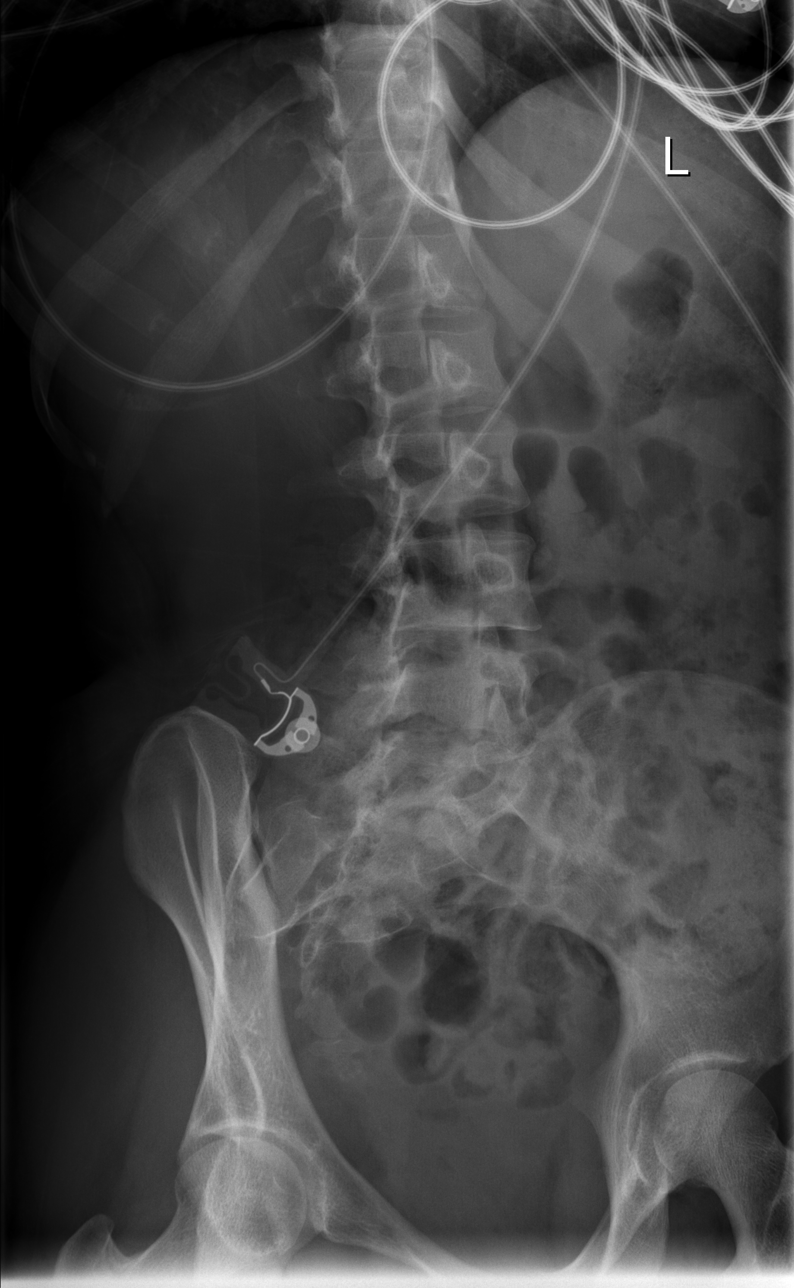

[t lumbar spine lat]
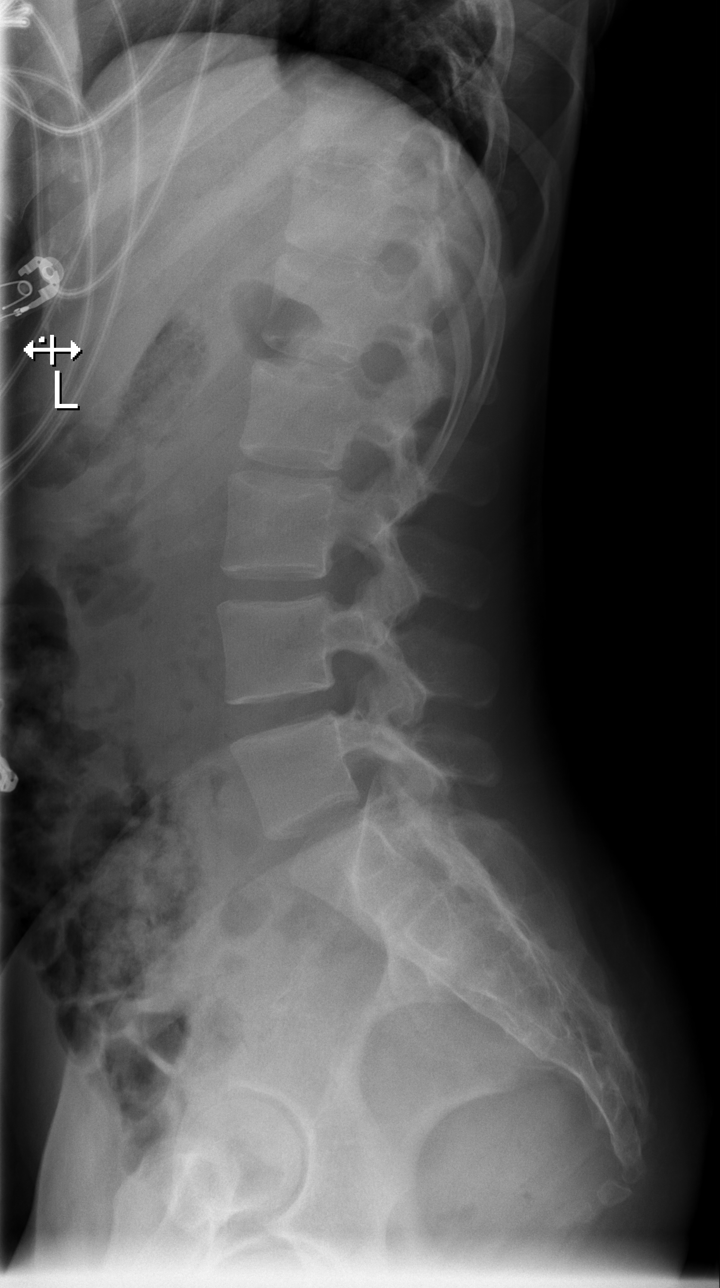

[t lumbar l-5 s-1 spot]
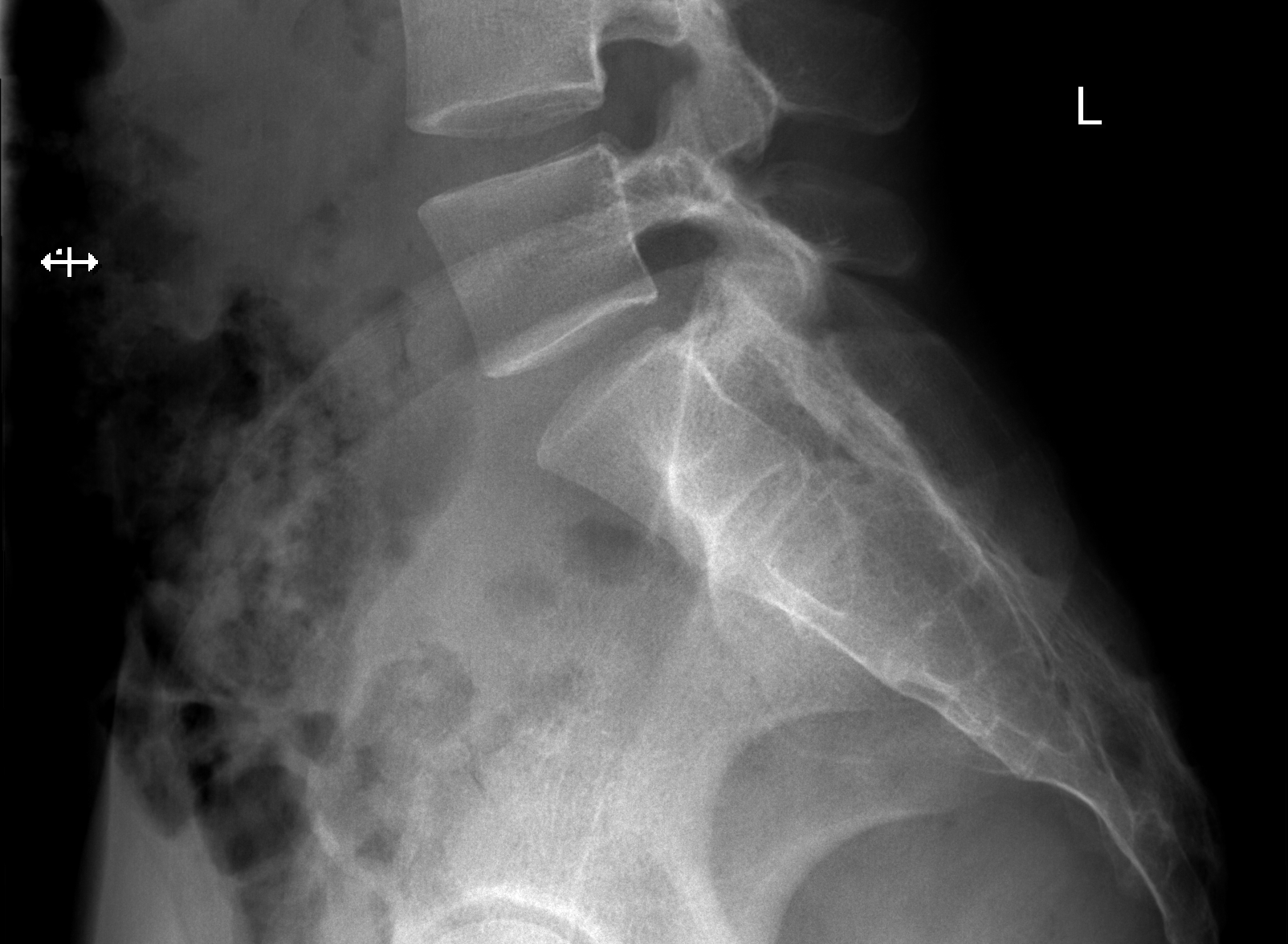

[5 of 5 positions shown; findings below may reference images not displayed]

FINDINGS: There is no evidence of lumbar spine fracture. Alignment is normal.
Intervertebral disc spaces are maintained.
IMPRESSION: Negative.

## 2020-02-04 ENCOUNTER — Other Ambulatory Visit: Payer: Self-pay

## 2020-02-04 ENCOUNTER — Ambulatory Visit (INDEPENDENT_AMBULATORY_CARE_PROVIDER_SITE_OTHER): Payer: Medicaid Other | Admitting: Family Medicine

## 2020-02-04 VITALS — BP 113/72 | HR 73 | Wt 122.0 lb

## 2020-02-04 DIAGNOSIS — Z3480 Encounter for supervision of other normal pregnancy, unspecified trimester: Secondary | ICD-10-CM

## 2020-02-04 DIAGNOSIS — R87612 Low grade squamous intraepithelial lesion on cytologic smear of cervix (LGSIL): Secondary | ICD-10-CM

## 2020-02-04 DIAGNOSIS — O10919 Unspecified pre-existing hypertension complicating pregnancy, unspecified trimester: Secondary | ICD-10-CM

## 2020-02-04 DIAGNOSIS — Z148 Genetic carrier of other disease: Secondary | ICD-10-CM

## 2020-02-04 NOTE — Progress Notes (Signed)
Pt. Presents for ROB, [redacted]w[redacted]d.

## 2020-02-04 NOTE — Progress Notes (Signed)
   PRENATAL VISIT NOTE  Subjective:  Stacie Harding is a 24 y.o. G2P1001 at [redacted]w[redacted]d being seen today for ongoing prenatal care.  She is currently monitored for the following issues for this high-risk pregnancy and has Left knee injury; LGSIL on Pap smear of cervix; Chronic hypertension affecting pregnancy; Supervision of other normal pregnancy, antepartum; Abnormal genetic test during pregnancy; and Genetic carrier on their problem list.  Patient reports no complaints.  Contractions: Not present. Vag. Bleeding: None.  Movement: Present. Denies leaking of fluid.   The following portions of the patient's history were reviewed and updated as appropriate: allergies, current medications, past family history, past medical history, past social history, past surgical history and problem list.   Objective:   Vitals:   02/04/20 1031  BP: 113/72  Pulse: 73  Weight: 122 lb (55.3 kg)    Fetal Status: Fetal Heart Rate (bpm): 148   Movement: Present     General:  Alert, oriented and cooperative. Patient is in no acute distress.  Skin: Skin is warm and dry. No rash noted.   Cardiovascular: Normal heart rate noted  Respiratory: Normal respiratory effort, no problems with respiration noted  Abdomen: Soft, gravid, appropriate for gestational age.  Pain/Pressure: Absent     Pelvic: Cervical exam deferred        Extremities: Normal range of motion.  Edema: None  Mental Status: Normal mood and affect. Normal behavior. Normal judgment and thought content.   Assessment and Plan:  Pregnancy: G2P1001 at [redacted]w[redacted]d 1. Supervision of other normal pregnancy, antepartum FHT and FH normal  2. Chronic hypertension affecting pregnancy Taking ASA 81mg  BP normal off meds  3. Genetic carrier Alpha thal trait. Referred to genetic counseling  4. LGSIL on Pap smear of cervix Last PAP normal  Preterm labor symptoms and general obstetric precautions including but not limited to vaginal bleeding, contractions, leaking of  fluid and fetal movement were reviewed in detail with the patient. Please refer to After Visit Summary for other counseling recommendations.   Return in about 4 weeks (around 03/03/2020) for OB f/u.  Future Appointments  Date Time Provider Department Center  02/23/2020  1:15 PM [facility] NURSE [facility]  02/23/2020  1:30 PM [facility] US3 [facility]  02/23/2020  2:30 PM [facility] GENETIC COUNSELING RM [facility]    [facility] DO

## 2020-02-17 ENCOUNTER — Emergency Department (HOSPITAL_BASED_OUTPATIENT_CLINIC_OR_DEPARTMENT_OTHER)
Admission: EM | Admit: 2020-02-17 | Discharge: 2020-02-17 | Disposition: A | Discharge status: Home / Self Care | Payer: Medicaid Other | Attending: Emergency Medicine | Admitting: Emergency Medicine

## 2020-02-17 ENCOUNTER — Other Ambulatory Visit: Payer: Self-pay

## 2020-02-17 ENCOUNTER — Encounter (HOSPITAL_BASED_OUTPATIENT_CLINIC_OR_DEPARTMENT_OTHER): Payer: Self-pay | Admitting: Emergency Medicine

## 2020-02-17 DIAGNOSIS — U071 COVID-19: Secondary | ICD-10-CM | POA: Insufficient documentation

## 2020-02-17 DIAGNOSIS — I10 Essential (primary) hypertension: Secondary | ICD-10-CM | POA: Insufficient documentation

## 2020-02-17 DIAGNOSIS — Z79899 Other long term (current) drug therapy: Secondary | ICD-10-CM | POA: Insufficient documentation

## 2020-02-17 DIAGNOSIS — Z3A00 Weeks of gestation of pregnancy not specified: Secondary | ICD-10-CM | POA: Insufficient documentation

## 2020-02-17 DIAGNOSIS — O2690 Pregnancy related conditions, unspecified, unspecified trimester: Secondary | ICD-10-CM | POA: Diagnosis present

## 2020-02-17 DIAGNOSIS — Z87891 Personal history of nicotine dependence: Secondary | ICD-10-CM | POA: Diagnosis not present

## 2020-02-17 HISTORY — DX: COVID-19: U07.1

## 2020-02-17 LAB — CBC WITH DIFFERENTIAL/PLATELET
Abs Immature Granulocytes: 0.01 10*3/uL (ref 0.00–0.07)
Basophils Absolute: 0 10*3/uL (ref 0.0–0.1)
Basophils Relative: 0 %
Eosinophils Absolute: 0 10*3/uL (ref 0.0–0.5)
Eosinophils Relative: 0 %
HCT: 32.3 % — ABNORMAL LOW (ref 36.0–46.0)
Hemoglobin: 11.3 g/dL — ABNORMAL LOW (ref 12.0–15.0)
Immature Granulocytes: 0 %
Lymphocytes Relative: 34 %
Lymphs Abs: 0.9 10*3/uL (ref 0.7–4.0)
MCH: 32 pg (ref 26.0–34.0)
MCHC: 35 g/dL (ref 30.0–36.0)
MCV: 91.5 fL (ref 80.0–100.0)
Monocytes Absolute: 0.2 10*3/uL (ref 0.1–1.0)
Monocytes Relative: 6 %
Neutro Abs: 1.5 10*3/uL — ABNORMAL LOW (ref 1.7–7.7)
Neutrophils Relative %: 60 %
Platelets: 139 10*3/uL — ABNORMAL LOW (ref 150–400)
RBC: 3.53 MIL/uL — ABNORMAL LOW (ref 3.87–5.11)
RDW: 12.1 % (ref 11.5–15.5)
WBC: 2.5 10*3/uL — ABNORMAL LOW (ref 4.0–10.5)
nRBC: 0 % (ref 0.0–0.2)

## 2020-02-17 LAB — COMPREHENSIVE METABOLIC PANEL
ALT: 12 U/L (ref 0–44)
AST: 17 U/L (ref 15–41)
Albumin: 3.1 g/dL — ABNORMAL LOW (ref 3.5–5.0)
Alkaline Phosphatase: 53 U/L (ref 38–126)
Anion gap: 8 (ref 5–15)
BUN: 8 mg/dL (ref 6–20)
CO2: 22 mmol/L (ref 22–32)
Calcium: 8.1 mg/dL — ABNORMAL LOW (ref 8.9–10.3)
Chloride: 103 mmol/L (ref 98–111)
Creatinine, Ser: 0.52 mg/dL (ref 0.44–1.00)
GFR calc Af Amer: >60 mL/min (ref >60)
GFR calc non Af Amer: >60 mL/min (ref >60)
Glucose, Bld: 82 mg/dL (ref 70–99)
Potassium: 3 mmol/L — ABNORMAL LOW (ref 3.5–5.1)
Sodium: 133 mmol/L — ABNORMAL LOW (ref 135–145)
Total Bilirubin: 0.6 mg/dL (ref 0.3–1.2)
Total Protein: 6.2 g/dL — ABNORMAL LOW (ref 6.5–8.1)

## 2020-02-17 LAB — SARS CORONAVIRUS 2 BY RT PCR (HOSPITAL ORDER, PERFORMED IN ~~LOC~~ HOSPITAL LAB): SARS Coronavirus 2: POSITIVE — AB

## 2020-02-17 MED ORDER — POTASSIUM CHLORIDE CRYS ER 20 MEQ PO TBCR
40.0000 meq | EXTENDED_RELEASE_TABLET | Freq: Once | ORAL | Status: AC
  Administered 2020-02-17: 40 meq via ORAL
  Filled 2020-02-17: qty 2

## 2020-02-17 MED ORDER — POTASSIUM CHLORIDE ER 10 MEQ PO TBCR: 20.0000 meq | EXTENDED_RELEASE_TABLET | Freq: Every day | ORAL | 0 refills | Status: DC

## 2020-02-17 NOTE — ED Provider Notes (Signed)
[facility] EMERGENCY DEPARTMENT Provider Note   CSN: [id number] Arrival date & time: 02/17/20  1024     History Chief Complaint  Patient presents with  . Dizziness  . Shortness of Breath  . Chest Pain  . 4 months pregnant    Stacie Harding is a 24 y.o. female for the past medical history of hypertension that presents the emergency department today for dizziness, shortness of breath, chest pain for the past week.  Patient is 4 months pregnant, G2 P1 T1.  States that for the past week she has been having these symptoms, also admits to slight headache.  No cough, congestion, sore throat.  States that she does have sick contact at home, her son is sick with a cold, has not been tested for Covid.  Not been vaccinated for Covid.  States that she has not felt like this in her previous pregnancy.  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  Has not tried anything for this besides Tylenol.  Does frequently see GYN.  States that chest pain occurs throughout the day, worse at night.  Is not pleuritic, no radiation, not worse upon exertion.  No cardiac history.  No calf swelling or calf tenderness, no tobacco use, no history of DVT, no recent surgery.  Patient without any fevers, chills, nausea, vomiting, abdominal pain, back pain, neck pain.  States that she is generally healthy.  States that headache waxes and wanes for the past week, no vision changes, neck pain, head injury, photophobia.  Not the worst headache of her life.  HPI     Past Medical History:  Diagnosis Date  . Hypertension     Patient Active Problem List   Diagnosis Date Noted  . Genetic carrier 01/05/2020  . Abnormal genetic test during pregnancy 12/16/2019  . Chronic hypertension affecting pregnancy 12/08/2019  . Supervision of other normal pregnancy, antepartum 12/08/2019  . LGSIL on Pap smear of cervix 09/13/2017  . Left knee injury 02/21/2012     Past Surgical History:  Procedure Laterality Date  . KNEE SURGERY       OB History    Gravida  2   Para  1   Term  1   Preterm  0   AB  0   Living  1     SAB  0   TAB  0   Ectopic  0   Multiple      Live Births  1           Family History  Problem Relation Age of Onset  . Diabetes Maternal Grandmother   . Hypertension Maternal Grandmother   . Sudden death Neg Hx   . Hyperlipidemia Neg Hx   . Heart attack Neg Hx     Social History   Tobacco Use  . Smoking status: Former Smoker    Quit date: 11/2016    Years since quitting: 3.2  . Smokeless tobacco: Never Used  Vaping Use  . Vaping Use: Never used  Substance Use Topics  . Alcohol use: Not Currently  . Drug use: No    Home Medications Prior to Admission medications   Medication Sig Start Date End Date Taking? Authorizing Provider  Doxylamine-Pyridoxine (DICLEGIS) 10-10 MG TBEC Take 2 tablets by mouth at bedtime as needed. Patient not taking: Reported on 02/04/2020 01/05/20   Aviva Signs, CNM  metoCLOPramide (REGLAN) 10 MG tablet Take 1 tablet (  10 mg total) by mouth every 8 (eight) hours as needed for nausea. Patient not taking: Reported on 12/08/2019 11/27/19   Judeth Horn, NP  ondansetron (ZOFRAN ODT) 4 MG disintegrating tablet Take 1 tablet (4 mg total) by mouth every 6 (six) hours as needed for nausea. Patient not taking: Reported on 02/04/2020 01/05/20   Aviva Signs, CNM  potassium chloride (KLOR-CON) 10 MEQ tablet Take 2 tablets (20 mEq total) by mouth daily for 4 days. 02/17/20 02/21/20  Farrel Gordon, PA-C  Prenatal Vit-Fe Fumarate-FA (PRENATAL VITAMIN) 27-0.8 MG TABS Take 1 tablet by mouth daily. 12/08/19   Aviva Signs, CNM  terconazole (TERAZOL 7) 0.4 % vaginal cream 1 applicator intravaginally QHS  For  3 days Patient not taking: Reported on 02/04/2020 12/10/19   Levie Heritage, DO    Allergies    Iodine  Review of Systems   Review of Systems  Constitutional: Negative for  chills, diaphoresis, fatigue and fever.  HENT: Negative for congestion, sore throat and trouble swallowing.   Eyes: Negative for pain and visual disturbance.  Respiratory: Positive for shortness of breath. Negative for cough and wheezing.   Cardiovascular: Positive for chest pain. Negative for palpitations and leg swelling.  Gastrointestinal: Negative for abdominal distention, abdominal pain, diarrhea, nausea and vomiting.  Genitourinary: Negative for difficulty urinating and flank pain.  Musculoskeletal: Negative for back pain, neck pain and neck stiffness.  Skin: Negative for pallor.  Neurological: Positive for headaches. Negative for dizziness, seizures, syncope, facial asymmetry, speech difficulty, weakness, light-headedness and numbness.  Psychiatric/Behavioral: Negative for confusion.    Physical Exam Updated Vital Signs BP 117/79   Pulse 69   Temp 98.1 F (36.7 C) (Oral)   Resp 18   LMP 10/08/2019   SpO2 100%   Physical Exam Constitutional:      General: She is not in acute distress.    Appearance: Normal appearance. She is not ill-appearing, toxic-appearing or diaphoretic.     Comments: Patient appears well, sitting upright in bed.  No use of accessory muscles.  Patient is able to speak to me in full sentences.  Patient handling secretions.  No distress.  HENT:     Head: Normocephalic and atraumatic.     Mouth/Throat:     Mouth: Mucous membranes are moist.     Pharynx: Oropharynx is clear.  Eyes:     General: No scleral icterus.    Extraocular Movements: Extraocular movements intact.     Pupils: Pupils are equal, round, and reactive to light.  Cardiovascular:     Rate and Rhythm: Normal rate and regular rhythm.     Pulses: Normal pulses.     Heart sounds: Normal heart sounds.  Pulmonary:     Effort: Pulmonary effort is normal. No respiratory distress.     Breath sounds: Normal breath sounds. No stridor. No wheezing, rhonchi or rales.  Chest:     Chest wall:  Tenderness (Tenderness to sternum upon palpation) present.  Abdominal:     General: Abdomen is flat. There is no distension.     Palpations: Abdomen is soft.     Tenderness: There is no abdominal tenderness. There is no guarding or rebound.  Musculoskeletal:        General: No swelling or tenderness. Normal range of motion.     Cervical back: Normal range of motion and neck supple. No rigidity or tenderness.     Right lower leg: No edema.     Left lower leg: No edema.  Skin:    General: Skin is warm and dry.     Capillary Refill: Capillary refill takes less than 2 seconds.     Coloration: Skin is not pale.  Neurological:     General: No focal deficit present.     Mental Status: She is alert and oriented to person, place, and time.  Psychiatric:        Mood and Affect: Mood normal.        Behavior: Behavior normal.     ED Results / Procedures / Treatments   Labs (all labs ordered are listed, but only abnormal results are displayed) Labs Reviewed  SARS CORONAVIRUS 2 BY RT PCR (HOSPITAL ORDER, PERFORMED IN [facility]) - Abnormal; Notable for the following components:      Result Value   SARS Coronavirus 2 POSITIVE (*)    All other components within normal limits  COMPREHENSIVE METABOLIC PANEL - Abnormal; Notable for the following components:   Sodium 133 (*)    Potassium 3.0 (*)    Calcium 8.1 (*)    Total Protein 6.2 (*)    Albumin 3.1 (*)    All other components within normal limits  CBC WITH DIFFERENTIAL/PLATELET - Abnormal; Notable for the following components:   WBC 2.5 (*)    RBC 3.53 (*)    Hemoglobin 11.3 (*)    HCT 32.3 (*)    Platelets 139 (*)    Neutro Abs 1.5 (*)    All other components within normal limits    EKG EKG Interpretation  Date/Time:  Wednesday February 17 2020 10:37:55 EDT Ventricular Rate:  75 PR Interval:    QRS Duration: 98 QT Interval:  399 QTC Calculation: 446 R Axis:   78 Text Interpretation: Sinus rhythm Atrial  premature complex No significant change since prior 3/15 Confirmed by Meridee ScoreButler, Michael [phone]([id number]) on 02/17/2020 10:47:08 AM   Radiology No results found.  Procedures Procedures (including critical care time)  Medications Ordered in ED Medications  potassium chloride SA (KLOR-CON) CR tablet 40 mEq (has no administration in time range)    ED Course  I have reviewed the triage vital signs and the nursing notes.  Pertinent labs & imaging results that were available during my care of the patient were reviewed by me and considered in my medical decision making (see chart for details).  Clinical Course as of Feb 16 1254  Wed Feb 17, 2020  [id number] 24 year old female 4 months pregnant here with a few days of short of breath and dizziness.  Benign exam satting 100% on room air.  Lab work showing slight neutropenia and low potassium.  Covid testing is positive.  Reviewed with patient.  Likely cause of her symptoms.   [MB]    Clinical Course User Index [MB] Terrilee FilesButler, Michael C, MD   MDM Rules/Calculators/A&P                         Stacie Pontrmani Harding is a 24 y.o. female for the past medical history of hypertension that presents the emergency department today for dizziness, shortness of breath, chest pain for the past week.  Patient also with headache, positive sick contact.  Has not been vaccinated for Covid.PERC 0 no signs clinically of PE at this time with pulse 69, satting 100% on room air and reproducible chest pain.  Covid test positive, labs suggestive of Covid.  Remarkable for potassium of 3, leukocytosis of 2.5, hemoglobin 11.3, platelets 139.  Will replete  potassium here today and give small prescription of potassium with repeat follow-up with PCP.  Did discuss monoclonal antibody infusion with patient since patient is pregnant, patient does not want this at this time.  Discussed symptomatic treatment along with following CDC guidelines in isolation.  Patient agreeable.  Patient to be discharged,  discussed hydration and Tylenol use.  Doubt need for further emergent work up at this time. I explained the diagnosis and have given explicit precautions to return to the ER including for any other new or worsening symptoms. The patient understands and accepts the medical plan as it's been dictated and I have answered their questions. Discharge instructions concerning home care and prescriptions have been given. The patient is STABLE and is discharged to home in good condition.  I discussed this case with my attending physician who cosigned this note including patient's presenting symptoms, physical exam, and planned diagnostics and interventions. Attending physician stated agreement with plan or made changes to plan which were implemented.   Attending physician assessed patient at bedside.  Final Clinical Impression(s) / ED Diagnoses Final diagnoses:  COVID-19    Rx / DC Orders ED Discharge Orders         Ordered    potassium chloride (KLOR-CON) 10 MEQ tablet  Daily        02/17/20 1255           Farrel Gordon, PA-C 02/17/20 1300    Terrilee Files, MD 02/17/20 1918

## 2020-02-17 NOTE — ED Notes (Signed)
Pt on monitor 

## 2020-02-17 NOTE — ED Triage Notes (Addendum)
Pt has been having SOB, chest pain and dizziness x 3 days. Was told by her OBGYN to take tylenol, but it hasn't helped. Pt is 4 months pregnant

## 2020-02-17 NOTE — Discharge Instructions (Addendum)
Your Covid test was positive today, I want you to use the attached instructions.  Please stay hydrated and use Tylenol as prescribed on the bottle.  If you have any new or worsening concerning symptoms please come back to the emergency department.  Your potassium was slightly low, I want you to take the potassium pills as prescribed.  Please get all of your blood work rechecked in the next 2 weeks.  Schedule yourself a telehealth visit with your primary care in the next couple of days to make sure you are improving.  Stay isolated for the next 10 days.  You are prescribed new medications today in the emergency department, make sure these are okay with your pharmacist before taking these.

## 2020-02-18 ENCOUNTER — Encounter: Payer: Self-pay | Admitting: Nurse Practitioner

## 2020-02-18 ENCOUNTER — Telehealth (HOSPITAL_COMMUNITY): Payer: Self-pay | Admitting: Nurse Practitioner

## 2020-02-18 DIAGNOSIS — U071 COVID-19: Secondary | ICD-10-CM | POA: Insufficient documentation

## 2020-02-18 NOTE — Telephone Encounter (Signed)
Called to discuss with Eulah Pont about Covid symptoms and the use of regeneron, a monoclonal antibody infusion for those with mild to moderate Covid symptoms and at a high risk of hospitalization.     Pt is qualified for this infusion at the [facility] center due to co-morbid conditions and/or a member of an at-risk group, however declines infusion at this time. Symptoms tier reviewed as well as criteria for ending isolation.  Symptoms reviewed that would warrant ED/Hospital evaluation. Preventative practices reviewed. Patient verbalized understanding. Patient advised to call back if he/she opts to proceed with infusion. Callback number provided. Urgent care and/or ER precautions given for severe symptoms.   Patient Active Problem List   Diagnosis Date Noted  . Genetic carrier 01/05/2020  . Abnormal genetic test during pregnancy 12/16/2019  . Chronic hypertension affecting pregnancy 12/08/2019  . Supervision of other normal pregnancy, antepartum 12/08/2019  . LGSIL on Pap smear of cervix 09/13/2017  . Left knee injury 02/21/2012    Consuello Masse, NP [facility]  [phone] [email or web address]

## 2020-02-23 ENCOUNTER — Ambulatory Visit: Payer: Medicaid Other

## 2020-02-23 ENCOUNTER — Ambulatory Visit: Payer: MEDICAID

## 2020-02-23 ENCOUNTER — Other Ambulatory Visit: Payer: Self-pay

## 2020-03-01 ENCOUNTER — Ambulatory Visit (HOSPITAL_BASED_OUTPATIENT_CLINIC_OR_DEPARTMENT_OTHER): Payer: Medicaid Other | Admitting: Genetic Counselor

## 2020-03-01 ENCOUNTER — Encounter: Payer: Self-pay | Admitting: *Deleted

## 2020-03-01 ENCOUNTER — Other Ambulatory Visit: Payer: Self-pay

## 2020-03-01 ENCOUNTER — Ambulatory Visit: Payer: Medicaid Other | Attending: Advanced Practice Midwife

## 2020-03-01 ENCOUNTER — Ambulatory Visit: Payer: Medicaid Other | Admitting: *Deleted

## 2020-03-01 ENCOUNTER — Ambulatory Visit: Payer: Self-pay

## 2020-03-01 DIAGNOSIS — Z315 Encounter for genetic counseling: Secondary | ICD-10-CM | POA: Diagnosis not present

## 2020-03-01 DIAGNOSIS — O0990 Supervision of high risk pregnancy, unspecified, unspecified trimester: Secondary | ICD-10-CM | POA: Insufficient documentation

## 2020-03-01 DIAGNOSIS — Z3480 Encounter for supervision of other normal pregnancy, unspecified trimester: Secondary | ICD-10-CM

## 2020-03-01 NOTE — Progress Notes (Signed)
03/01/2020  Stacie Harding [birth year] MRN: [id number] DOV: 03/01/2020  Stacie Harding presented to the [facility] for a genetics consultation regarding her carrier status for spinal muscular atrophy. Stacie Harding was accompanied to her appointment by her partner, Stacie Harding.   Indication for genetic counseling - Increased risk to be silent (2+0) carrier for spinal muscular atrophy  Prenatal history  Stacie Harding is a G30P1001, 24 y.o. female. Her current pregnancy has completed [redacted]w[redacted]d (Estimated Date of Delivery: 07/14/20). Stacie Harding and her partner have a two year old son together.  Stacie Harding denied exposure to environmental toxins or chemical agents. She denied the use of alcohol, tobacco or street drugs. She denied significant viral illnesses, fevers, and bleeding during the course of her pregnancy. She reported a personal history of anemia and hypertension. Her medical and surgical histories were otherwise noncontributory.  Family History  A three generation pedigree was drafted and reviewed. The family history is remarkable for the following:  - Stacie Harding has a paternal half brother who was born with hydrocephalus requiring a shunt. He was reportedly born premature. Hydrocephalus is a condition where excess cerebrospinal fluid accumulates in the brain. We discussed that preterm infants are at risk for perinatal complications including hydrocephalus. Since Stacie Harding brother was born prematurely, it is likely that his hydrocephalus was a result of his prematurity.  - Stacie Harding mother was born with bilateral polydactyly. We discussed that polydactyly is a form of a congenital birth defect. In every pregnancy, a woman has a 3-5% chance of having a baby with a birth defect. However, isolated polydactyly in particular can appear to run in families as a dominant trait with variable expressivity and reduced penetrance. Thus, there is up to a 50% risk that Stacie Harding children  could also have polydactyly in addition to the risk for other forms of birth defects.   - Stacie Harding maternal grandfather was born with a "hole in his heart". The exact etiology of this congenital heart defect (CHD) is unknown. We discussed that there are multifactorial causes for isolated congenital heart defects (CHDs), including environmental and genetic factors. Given that Stacie Harding grandfather is a third degree relative to Stacie Harding children, the chance that Stacie Harding children could have a CHD is likely not greatly elevated over the 0.5-1% general population risk. However, without knowing the precise etiology, risk assessment is limited.   The remaining family histories were reviewed and found to be noncontributory for birth defects, intellectual disability, recurrent pregnancy loss, and known genetic conditions.    The patient's ethnicity is African American. The father of the pregnancy's ethnicity is African American. Ashkenazi Jewish ancestry and consanguinity were denied. Pedigree will be scanned under Media.  Discussion  Spinal muscular atrophy:  [name] had carrier screening for spinal muscular atrophy (SMA) performed through [facility]. Based on the results of this screen, [name] was found to have 2 copies of the SMN1 gene; however, she also has the c.*3+80T>G polymorphism of SMN1 in intron 7 (also known as [id number]). This puts her at increased risk (1 in 34, or ~3%) to be a silent 2+0 carrier for SMA. We discussed that SMA is a condition caused by mutations in the SMN1 gene. Typically, individuals have two copies of the SMN1 gene, with one copy present on each chromosome. In SMA silent carriers, both copies of the SMN1 gene are present on one chromosome, with no copies of SMN1 present on the other chromosome.  SMA is characterized by progressive muscle weakness and atrophy due to degeneration and loss of anterior horn cells (lower motor neurons) in the spinal cord and  brain stem. We discussed the different types of SMA (0, I, II, and III), including differences in severity and age of onset. We also reviewed the autosomal recessive inheritance pattern of SMA. We discussed that the couple only has a chance of having a child with SMA if both parents are identified as carriers for the condition. Per [facility], the pre-test carrier risk for SMA in the [place] American population is 1 in 82. Thus [name] partner currently has a 1 in 41 chance of being a carrier of SMA. Without partner screening to refine risk and based on her partner's ethnicity, the couple currently has a 1 in 9792 (0.01%) risk of having a child with SMA. If [name] partner were found to have 2 copies of SMN1, his risk of being a carrier would be reduced but not eliminated. If both [name] and her partner are carriers of SMA, they would have a 1 in 4 (25%) chance of having an affected fetus with every pregnancy. We discussed that carrier screening for SMA is recommended for [name] partner to refine the couple's risk of having an affected child. The couple indicated that they are not interested in pursuing partner carrier screening.  Other carrier screening results:  [name] had carrier screening for 12 genes performed in her previous pregnancy via [facility]'s Inher[employer] Society-Guided Panel. This carrier screening was normal, significantly reducing [name] chances of being a carrier for and thus having a child affected by one of these 12 conditions. [name] has also had a normal hemoglobin electrophoresis, significantly reducing her chances of being a carrier for a hemoglobinopathy such as sickle cell disease.  Of note, SMA was included on [name] negative 2018 carrier screening panel. However, the method for which SMA carrier screening is now performed is more comprehensive than the methodology in 2018, as SMA carrier screening now assesses for the SNP that is more common in silent  carriers. Individuals who were at increased risk to be a silent carrier would likely have been missed by carrier screening in 2018, as is the case for Ms. Cannedy.  Ultrasound:  A complete ultrasound was performed today prior to our visit. The ultrasound report will be sent under separate cover. There were no visualized fetal anomalies or markers suggestive of aneuploidy.  Aneuploidy screening:  Since Ms. Rosner had not had aneuploidy screening, we reviewed noninvasive prenatal screening (NIPS) as an available screening option. Specifically, we discussed that NIPS analyzes cell free DNA originating from the placenta that is found in the maternal blood circulation during pregnancy. This test is not diagnostic for chromosome conditions, but can provide information regarding the presence or absence of extra fetal DNA for chromosomes 13, 18, 21, and the sex chromosomes. Thus, it would not identify or rule out all fetal aneuploidy. The reported detection rate is 91-99% for trisomies 21, 18, 13, and sex chromosome aneuploidies. The false positive rate is reported to be less than 0.1% for any of these conditions. Ms. Gunnells indicated that she is not interested in undergoing NIPS or any form of aneuploidy screening.  Diagnostic testing:  Ms. Adelson was also counseled regarding diagnostic testing via amniocentesis. We discussed the technical aspects of the procedure and quoted up to a 1 in 500 (0.2%) risk for spontaneous pregnancy loss or other adverse pregnancy outcomes as a result  of amniocentesis. Cultured cells from an amniocentesis sample allow for the visualization of a fetal karyotype, which can detect >99% of large chromosomal aberrations. Chromosomal microarray can also be performed to identify smaller deletions or duplications of fetal chromosomal material. Amniocentesis could also be performed to assess whether the baby is affected by SMA. After careful consideration, Ms. Aube declined amniocentesis at  this time. She understands that amniocentesis is available at any point after 16 weeks of pregnancy and that she may opt to undergo the procedure at a later date should she change her mind.  Plan:  Additional screening and diagnostic testing were declined today. Ms. Zody understands that screening tests, including ultrasound, cannot rule out all birth defects or genetic syndromes. The patient was advised of this limitation and states she still does not want additional testing or screening at this time.   I counseled Ms. Faughnan regarding the above risks and available options. The approximate face-to-face time with the genetic counselor was 25 minutes.  In summary:  Discussed carrier screening results and options for follow-up testing  Increased risk (1 in 34, ~3%) to be silent carrier for spinal muscular atrophy  Declined partner carrier screening  Reviewed results of ultrasound  No fetal anomalies or markers seen  Reduction in risk for fetal aneuploidy  Offered additional testing and screening  Declined aneuploidy screening via NIPS  Declined amniocentesis  Reviewed family history concerns   Gershon Crane, MS, Aeronautical engineer

## 2020-03-02 ENCOUNTER — Other Ambulatory Visit: Payer: Self-pay | Admitting: *Deleted

## 2020-03-02 DIAGNOSIS — O10919 Unspecified pre-existing hypertension complicating pregnancy, unspecified trimester: Secondary | ICD-10-CM

## 2020-03-03 ENCOUNTER — Other Ambulatory Visit: Payer: Self-pay

## 2020-03-03 ENCOUNTER — Ambulatory Visit (INDEPENDENT_AMBULATORY_CARE_PROVIDER_SITE_OTHER): Payer: Medicaid Other | Admitting: Family Medicine

## 2020-03-03 VITALS — BP 114/74 | HR 81

## 2020-03-03 DIAGNOSIS — O285 Abnormal chromosomal and genetic finding on antenatal screening of mother: Secondary | ICD-10-CM

## 2020-03-03 DIAGNOSIS — Z3A21 21 weeks gestation of pregnancy: Secondary | ICD-10-CM

## 2020-03-03 DIAGNOSIS — U071 COVID-19: Secondary | ICD-10-CM

## 2020-03-03 DIAGNOSIS — Z3480 Encounter for supervision of other normal pregnancy, unspecified trimester: Secondary | ICD-10-CM

## 2020-03-03 DIAGNOSIS — R87612 Low grade squamous intraepithelial lesion on cytologic smear of cervix (LGSIL): Secondary | ICD-10-CM

## 2020-03-03 DIAGNOSIS — O10919 Unspecified pre-existing hypertension complicating pregnancy, unspecified trimester: Secondary | ICD-10-CM

## 2020-03-03 NOTE — Progress Notes (Signed)
   PRENATAL VISIT NOTE  Subjective:  Stacie Harding is a 24 y.o. G2P1001 at [redacted]w[redacted]d being seen today for ongoing prenatal care.  She is currently monitored for the following issues for this low-risk pregnancy and has Left knee injury; LGSIL on Pap smear of cervix; Chronic hypertension affecting pregnancy; Supervision of other normal pregnancy, antepartum; Abnormal genetic test during pregnancy; Genetic carrier; and COVID-19 virus infection on their problem list.  Patient reports no complaints.  Contractions: Not present. Vag. Bleeding: None.  Movement: Present. Denies leaking of fluid.   The following portions of the patient's history were reviewed and updated as appropriate: allergies, current medications, past family history, past medical history, past social history, past surgical history and problem list.   Objective:   Vitals:   03/03/20 0935  BP: 114/74  Pulse: 81    Fetal Status: Fetal Heart Rate (bpm): 150 Fundal Height: 21 cm Movement: Present     General:  Alert, oriented and cooperative. Patient is in no acute distress.  Skin: Skin is warm and dry. No rash noted.   Cardiovascular: Normal heart rate noted  Respiratory: Normal respiratory effort, no problems with respiration noted  Abdomen: Soft, gravid, appropriate for gestational age.  Pain/Pressure: Absent     Pelvic: Cervical exam deferred        Extremities: Normal range of motion.  Edema: None  Mental Status: Normal mood and affect. Normal behavior. Normal judgment and thought content.   Assessment and Plan:  Pregnancy: G2P1001 at [redacted]w[redacted]d  1. Supervision of other normal pregnancy, antepartum FHT and FH normal. [place] incomplete, but normal.  2. Chronic hypertension affecting pregnancy Controlled off meds. ASA 81mg   3. LGSIL on Pap smear of cervix In 2019 - last PAP 2021 normal  4. [redacted] weeks gestation of pregnancy  5. Abnormal genetic test during pregnancy SMA carrier  6. COVID-19 virus infection Dx/d 02/17/20. Mild  symptoms (lost taste/smell, which has improved). No residual cough, SOB, fatigue, etc. Discussed vaccine delay until 90 days from infection.   Preterm labor symptoms and general obstetric precautions including but not limited to vaginal bleeding, contractions, leaking of fluid and fetal movement were reviewed in detail with the patient. Please refer to After Visit Summary for other counseling recommendations.   Return in about 4 weeks (around 03/31/2020).  Future Appointments  Date Time Provider Department Center  03/29/2020 12:30 PM [facility] NURSE [facility]  03/29/2020 12:45 PM [facility]5 [facility]    03/31/2020, DO

## 2020-03-29 ENCOUNTER — Other Ambulatory Visit: Payer: Self-pay

## 2020-03-29 ENCOUNTER — Ambulatory Visit: Payer: Medicaid Other | Attending: Obstetrics and Gynecology

## 2020-03-29 ENCOUNTER — Ambulatory Visit: Payer: Medicaid Other | Admitting: *Deleted

## 2020-03-29 ENCOUNTER — Encounter: Payer: Self-pay | Admitting: *Deleted

## 2020-03-29 ENCOUNTER — Other Ambulatory Visit: Payer: Self-pay | Admitting: *Deleted

## 2020-03-29 DIAGNOSIS — Z362 Encounter for other antenatal screening follow-up: Secondary | ICD-10-CM | POA: Diagnosis not present

## 2020-03-29 DIAGNOSIS — O10912 Unspecified pre-existing hypertension complicating pregnancy, second trimester: Secondary | ICD-10-CM

## 2020-03-29 DIAGNOSIS — Z3A24 24 weeks gestation of pregnancy: Secondary | ICD-10-CM

## 2020-03-29 DIAGNOSIS — Z148 Genetic carrier of other disease: Secondary | ICD-10-CM

## 2020-03-29 DIAGNOSIS — Z3480 Encounter for supervision of other normal pregnancy, unspecified trimester: Secondary | ICD-10-CM

## 2020-03-29 DIAGNOSIS — O10919 Unspecified pre-existing hypertension complicating pregnancy, unspecified trimester: Secondary | ICD-10-CM

## 2020-03-31 ENCOUNTER — Encounter: Payer: Medicaid Other | Admitting: Family Medicine

## 2020-04-26 ENCOUNTER — Other Ambulatory Visit: Payer: Self-pay | Admitting: *Deleted

## 2020-04-26 ENCOUNTER — Ambulatory Visit: Payer: Medicaid Other | Admitting: *Deleted

## 2020-04-26 ENCOUNTER — Other Ambulatory Visit: Payer: Self-pay

## 2020-04-26 ENCOUNTER — Ambulatory Visit: Payer: Medicaid Other | Attending: Obstetrics and Gynecology

## 2020-04-26 ENCOUNTER — Encounter: Payer: Self-pay | Admitting: *Deleted

## 2020-04-26 DIAGNOSIS — O10919 Unspecified pre-existing hypertension complicating pregnancy, unspecified trimester: Secondary | ICD-10-CM | POA: Diagnosis not present

## 2020-04-26 DIAGNOSIS — Z3A28 28 weeks gestation of pregnancy: Secondary | ICD-10-CM

## 2020-04-26 DIAGNOSIS — Z3480 Encounter for supervision of other normal pregnancy, unspecified trimester: Secondary | ICD-10-CM

## 2020-04-26 DIAGNOSIS — Z148 Genetic carrier of other disease: Secondary | ICD-10-CM

## 2020-04-26 DIAGNOSIS — Z362 Encounter for other antenatal screening follow-up: Secondary | ICD-10-CM

## 2020-04-26 DIAGNOSIS — O10013 Pre-existing essential hypertension complicating pregnancy, third trimester: Secondary | ICD-10-CM | POA: Diagnosis not present

## 2020-04-27 ENCOUNTER — Ambulatory Visit (INDEPENDENT_AMBULATORY_CARE_PROVIDER_SITE_OTHER): Payer: Medicaid Other | Admitting: Family Medicine

## 2020-04-27 VITALS — BP 108/66 | HR 78 | Wt 135.0 lb

## 2020-04-27 DIAGNOSIS — O10919 Unspecified pre-existing hypertension complicating pregnancy, unspecified trimester: Secondary | ICD-10-CM

## 2020-04-27 DIAGNOSIS — Z3480 Encounter for supervision of other normal pregnancy, unspecified trimester: Secondary | ICD-10-CM

## 2020-04-27 NOTE — Progress Notes (Signed)
   PRENATAL VISIT NOTE  Subjective:  Stacie Harding is a 24 y.o. G2P1001 at [redacted]w[redacted]d being seen today for ongoing prenatal care.  She is currently monitored for the following issues for this low-risk pregnancy and has Left knee injury; LGSIL on Pap smear of cervix; Chronic hypertension affecting pregnancy; Supervision of other normal pregnancy, antepartum; Abnormal genetic test during pregnancy; Genetic carrier; and COVID-19 virus infection on their problem list.  Patient reports no complaints.  Contractions: Not present. Vag. Bleeding: None.  Movement: Present. Denies leaking of fluid.   The following portions of the patient's history were reviewed and updated as appropriate: allergies, current medications, past family history, past medical history, past social history, past surgical history and problem list.   Objective:   Vitals:   04/27/20 1309  BP: 108/66  Pulse: 78  Weight: 135 lb (61.2 kg)    Fetal Status: Fetal Heart Rate (bpm): 139 Fundal Height: 28 cm Movement: Present     General:  Alert, oriented and cooperative. Patient is in no acute distress.  Skin: Skin is warm and dry. No rash noted.   Cardiovascular: Normal heart rate noted  Respiratory: Normal respiratory effort, no problems with respiration noted  Abdomen: Soft, gravid, appropriate for gestational age.  Pain/Pressure: Present     Pelvic: Cervical exam deferred        Extremities: Normal range of motion.  Edema: None  Mental Status: Normal mood and affect. Normal behavior. Normal judgment and thought content.   Assessment and Plan:  Pregnancy: G2P1001 at [redacted]w[redacted]d 1. Supervision of other normal pregnancy, antepartum Continue routine prenatal care. Declined TDaP Declined Flu 28 wk labs next week  2. Chronic hypertension affecting pregnancy Had HTN last pregnancy---BP is normal throughout the pregnancy.  Preterm labor symptoms and general obstetric precautions including but not limited to vaginal bleeding,  contractions, leaking of fluid and fetal movement were reviewed in detail with the patient. Please refer to After Visit Summary for other counseling recommendations.   Return in 2 weeks (on 05/11/2020).  Future Appointments  Date Time Provider Department Center  05/04/2020  8:30 AM [facility] NURSE [facility] None  05/10/2020 10:50 AM Aviva Signs, CNM [facility] None  05/25/2020  1:00 PM [facility]SE [facility]  05/25/2020  1:15 PM [facility]2 [facility]    Reva Bores, MD

## 2020-04-27 NOTE — Patient Instructions (Signed)

## 2020-05-04 ENCOUNTER — Ambulatory Visit: Payer: Medicaid Other

## 2020-05-05 ENCOUNTER — Ambulatory Visit: Payer: Medicaid Other

## 2020-05-10 ENCOUNTER — Encounter: Payer: Medicaid Other | Admitting: Advanced Practice Midwife

## 2020-05-13 ENCOUNTER — Encounter: Payer: Medicaid Other | Admitting: Family Medicine

## 2020-05-17 ENCOUNTER — Ambulatory Visit (INDEPENDENT_AMBULATORY_CARE_PROVIDER_SITE_OTHER): Payer: Medicaid Other | Admitting: Advanced Practice Midwife

## 2020-05-17 ENCOUNTER — Other Ambulatory Visit: Payer: Self-pay

## 2020-05-17 VITALS — BP 109/70 | HR 78 | Wt 141.0 lb

## 2020-05-17 DIAGNOSIS — Z3A31 31 weeks gestation of pregnancy: Secondary | ICD-10-CM

## 2020-05-17 DIAGNOSIS — Z3480 Encounter for supervision of other normal pregnancy, unspecified trimester: Secondary | ICD-10-CM

## 2020-05-17 DIAGNOSIS — R87612 Low grade squamous intraepithelial lesion on cytologic smear of cervix (LGSIL): Secondary | ICD-10-CM

## 2020-05-17 DIAGNOSIS — O10919 Unspecified pre-existing hypertension complicating pregnancy, unspecified trimester: Secondary | ICD-10-CM

## 2020-05-17 NOTE — Progress Notes (Signed)
   PRENATAL VISIT NOTE  Subjective:  Stacie Harding is a 24 y.o. G2P1001 at [redacted]w[redacted]d being seen today for ongoing prenatal care.  She is currently monitored for the following issues for this low-risk pregnancy and has Left knee injury; LGSIL on Pap smear of cervix; Chronic hypertension affecting pregnancy; Supervision of other normal pregnancy, antepartum; Abnormal genetic test during pregnancy; Genetic carrier; and COVID-19 virus infection on their problem list.  Patient reports some low back pain, occasional nausea when she gets hot..  Contractions: Not present. Vag. Bleeding: None.  Movement: Present. Denies leaking of fluid.   The following portions of the patient's history were reviewed and updated as appropriate: allergies, current medications, past family history, past medical history, past social history, past surgical history and problem list.   Objective:   Vitals:   05/17/20 0845  BP: 109/70  Pulse: 78  Weight: 141 lb (64 kg)    Fetal Status: Fetal Heart Rate (bpm): 138   Movement: Present     General:  Alert, oriented and cooperative. Patient is in no acute distress.  Skin: Skin is warm and dry. No rash noted.   Cardiovascular: Normal heart rate noted  Respiratory: Normal respiratory effort, no problems with respiration noted  Abdomen: Soft, gravid, appropriate for gestational age.  Pain/Pressure: Present     Pelvic: Cervical exam deferred        Extremities: Normal range of motion.  Edema: None  Mental Status: Normal mood and affect. Normal behavior. Normal judgment and thought content.   Assessment and Plan:  Pregnancy: G2P1001 at [redacted]w[redacted]d 1. [redacted] weeks gestation of pregnancy     Late getting in for testing, admits to eating pretzels.  Will get 1 hour test for expedited results - Glucose tolerance, 1 hour - CBC - HIV antibody (with reflex) - RPR  2. Chronic hypertension affecting pregnancy    Normotensive so far - Glucose tolerance, 1 hour - CBC - HIV antibody (with  reflex) - RPR  3. LGSIL on Pap smear of cervix     2019, normal in 2021  4. Supervision of other normal pregnancy, antepartum   Preterm labor symptoms and general obstetric precautions including but not limited to vaginal bleeding, contractions, leaking of fluid and fetal movement were reviewed in detail with the patient. Please refer to After Visit Summary for other counseling recommendations.   Return in about 2 weeks (around 05/31/2020) for [facility].  Future Appointments  Date Time Provider Department Center  05/25/2020  1:00 PM [facility] NURSE [facility]  05/25/2020  1:15 PM [facility] US2 [facility]  06/01/2020  2:45 PM Stinson, Rhona Raider, DO [facility] None    Wynelle Bourgeois, CNM

## 2020-05-17 NOTE — Patient Instructions (Signed)
Glucose Tolerance Test During Pregnancy Why am I having this test? The glucose tolerance test (GTT) is done to check how your body processes sugar (glucose). This is one of several tests used to diagnose diabetes that develops during pregnancy (gestational diabetes mellitus). Gestational diabetes is a temporary form of diabetes that some women develop during pregnancy. It usually occurs during the second trimester of pregnancy and goes away after delivery. Testing (screening) for gestational diabetes usually occurs between 24 and 28 weeks of pregnancy. You may have the GTT test after having a 1-hour glucose screening test if the results from that test indicate that you may have gestational diabetes. You may also have this test if:  You have a history of gestational diabetes.  You have a history of giving birth to very large babies or have experienced repeated fetal loss (stillbirth).  You have signs and symptoms of diabetes, such as: ? Changes in your vision. ? Tingling or numbness in your hands or feet. ? Changes in hunger, thirst, and urination that are not otherwise explained by your pregnancy. What is being tested? This test measures the amount of glucose in your blood at different times during a period of 3 hours. This indicates how well your body is able to process glucose. What kind of sample is taken?  Blood samples are required for this test. They are usually collected by inserting a needle into a blood vessel. How do I prepare for this test?  For 3 days before your test, eat normally. Have plenty of carbohydrate-rich foods.  Follow instructions from your health care provider about: ? Eating or drinking restrictions on the day of the test. You may be asked to not eat or drink anything other than water (fast) starting 8-10 hours before the test. ? Changing or stopping your regular medicines. Some medicines may interfere with this test. Tell a health care provider about:  All  medicines you are taking, including vitamins, herbs, eye drops, creams, and over-the-counter medicines.  Any blood disorders you have.  Any surgeries you have had.  Any medical conditions you have. What happens during the test? First, your blood glucose will be measured. This is referred to as your fasting blood glucose, since you fasted before the test. Then, you will drink a glucose solution that contains a certain amount of glucose. Your blood glucose will be measured again 1, 2, and 3 hours after drinking the solution. This test takes about 3 hours to complete. You will need to stay at the testing location during this time. During the testing period:  Do not eat or drink anything other than the glucose solution.  Do not exercise.  Do not use any products that contain nicotine or tobacco, such as cigarettes and e-cigarettes. If you need help stopping, ask your health care provider. The testing procedure may vary among health care providers and hospitals. How are the results reported? Your results will be reported as milligrams of glucose per deciliter of blood (mg/dL) or millimoles per liter (mmol/L). Your health care provider will compare your results to normal ranges that were established after testing a large group of people (reference ranges). Reference ranges may vary among labs and hospitals. For this test, common reference ranges are:  Fasting: less than 95-105 mg/dL (5.3-5.8 mmol/L).  1 hour after drinking glucose: less than 180-190 mg/dL (10.0-10.5 mmol/L).  2 hours after drinking glucose: less than 155-165 mg/dL (8.6-9.2 mmol/L).  3 hours after drinking glucose: 140-145 mg/dL (7.8-8.1 mmol/L). What do the   results mean? Results within reference ranges are considered normal, meaning that your glucose levels are well-controlled. If two or more of your blood glucose levels are high, you may be diagnosed with gestational diabetes. If only one level is high, your health care  provider may suggest repeat testing or other tests to confirm a diagnosis. Talk with your health care provider about what your results mean. Questions to ask your health care provider Ask your health care provider, or the department that is doing the test:  When will my results be ready?  How will I get my results?  What are my treatment options?  What other tests do I need?  What are my next steps? Summary  The glucose tolerance test (GTT) is one of several tests used to diagnose diabetes that develops during pregnancy (gestational diabetes mellitus). Gestational diabetes is a temporary form of diabetes that some women develop during pregnancy.  You may have the GTT test after having a 1-hour glucose screening test if the results from that test indicate that you may have gestational diabetes. You may also have this test if you have any symptoms or risk factors for gestational diabetes.  Talk with your health care provider about what your results mean. This information is not intended to replace advice given to you by your health care provider. Make sure you discuss any questions you have with your health care provider. Document Revised: 09/11/2018 Document Reviewed: 12/31/2016 Elsevier Patient Education  2020 Elsevier Inc.  

## 2020-05-18 LAB — CBC
Hematocrit: 33.7 % — ABNORMAL LOW (ref 34.0–46.6)
Hemoglobin: 11.6 g/dL (ref 11.1–15.9)
MCH: 31.8 pg (ref 26.6–33.0)
MCHC: 34.4 g/dL (ref 31.5–35.7)
MCV: 92 fL (ref 79–97)
Platelets: 191 10*3/uL (ref 150–450)
RBC: 3.65 x10E6/uL — ABNORMAL LOW (ref 3.77–5.28)
RDW: 12.8 % (ref 11.7–15.4)
WBC: 4.3 10*3/uL (ref 3.4–10.8)

## 2020-05-18 LAB — HIV ANTIBODY (ROUTINE TESTING W REFLEX): HIV Screen 4th Generation wRfx: NONREACTIVE

## 2020-05-18 LAB — GLUCOSE TOLERANCE, 1 HOUR: Glucose, 1Hr PP: 85 mg/dL (ref 65–199)

## 2020-05-18 LAB — RPR: RPR Ser Ql: NONREACTIVE

## 2020-05-23 ENCOUNTER — Encounter (HOSPITAL_COMMUNITY): Payer: Self-pay | Admitting: Obstetrics and Gynecology

## 2020-05-23 ENCOUNTER — Other Ambulatory Visit: Payer: Self-pay

## 2020-05-23 ENCOUNTER — Inpatient Hospital Stay (HOSPITAL_COMMUNITY)
Admission: AD | Admit: 2020-05-23 | Discharge: 2020-05-23 | Disposition: A | Discharge status: Home / Self Care | Payer: Medicaid Other | Attending: Family Medicine | Admitting: Family Medicine

## 2020-05-23 DIAGNOSIS — O23593 Infection of other part of genital tract in pregnancy, third trimester: Secondary | ICD-10-CM | POA: Insufficient documentation

## 2020-05-23 DIAGNOSIS — O212 Late vomiting of pregnancy: Secondary | ICD-10-CM | POA: Diagnosis not present

## 2020-05-23 DIAGNOSIS — Z3A32 32 weeks gestation of pregnancy: Secondary | ICD-10-CM | POA: Insufficient documentation

## 2020-05-23 DIAGNOSIS — O219 Vomiting of pregnancy, unspecified: Secondary | ICD-10-CM | POA: Diagnosis not present

## 2020-05-23 DIAGNOSIS — Z8616 Personal history of COVID-19: Secondary | ICD-10-CM | POA: Diagnosis not present

## 2020-05-23 DIAGNOSIS — N760 Acute vaginitis: Secondary | ICD-10-CM

## 2020-05-23 DIAGNOSIS — Z87891 Personal history of nicotine dependence: Secondary | ICD-10-CM | POA: Diagnosis not present

## 2020-05-23 DIAGNOSIS — R109 Unspecified abdominal pain: Secondary | ICD-10-CM

## 2020-05-23 DIAGNOSIS — R1030 Lower abdominal pain, unspecified: Secondary | ICD-10-CM | POA: Insufficient documentation

## 2020-05-23 DIAGNOSIS — O99891 Other specified diseases and conditions complicating pregnancy: Secondary | ICD-10-CM

## 2020-05-23 DIAGNOSIS — B9689 Other specified bacterial agents as the cause of diseases classified elsewhere: Secondary | ICD-10-CM | POA: Diagnosis not present

## 2020-05-23 DIAGNOSIS — O26899 Other specified pregnancy related conditions, unspecified trimester: Secondary | ICD-10-CM

## 2020-05-23 DIAGNOSIS — O26893 Other specified pregnancy related conditions, third trimester: Secondary | ICD-10-CM | POA: Insufficient documentation

## 2020-05-23 LAB — URINALYSIS, ROUTINE W REFLEX MICROSCOPIC
Bilirubin Urine: NEGATIVE
Glucose, UA: NEGATIVE mg/dL
Hgb urine dipstick: NEGATIVE
Ketones, ur: 20 mg/dL — AB
Nitrite: NEGATIVE
Protein, ur: NEGATIVE mg/dL
Specific Gravity, Urine: 1.015 (ref 1.005–1.030)
pH: 7 (ref 5.0–8.0)

## 2020-05-23 LAB — WET PREP, GENITAL
Sperm: NONE SEEN
Trich, Wet Prep: NONE SEEN
Yeast Wet Prep HPF POC: NONE SEEN

## 2020-05-23 MED ORDER — DOXYLAMINE-PYRIDOXINE 10-10 MG PO TBEC: 1.0000 | DELAYED_RELEASE_TABLET | Freq: Every day | ORAL | 2 refills | Status: DC

## 2020-05-23 MED ORDER — FAMOTIDINE IN NACL 20-0.9 MG/50ML-% IV SOLN
20.0000 mg | Freq: Once | INTRAVENOUS | Status: AC
  Administered 2020-05-23: 16:00:00 20 mg via INTRAVENOUS
  Filled 2020-05-23: qty 50

## 2020-05-23 MED ORDER — SODIUM CHLORIDE 0.9 % IV SOLN
25.0000 mg | Freq: Once | INTRAVENOUS | Status: AC
  Administered 2020-05-23: 16:00:00 25 mg via INTRAVENOUS
  Filled 2020-05-23: qty 1

## 2020-05-23 MED ORDER — METRONIDAZOLE 500 MG PO TABS: 500.0000 mg | ORAL_TABLET | Freq: Two times a day (BID) | ORAL | 0 refills | Status: DC

## 2020-05-23 NOTE — MAU Provider Note (Signed)
Chief Complaint:  Abdominal Pain and Emesis   Event Date/Time   First Provider Initiated Contact with Patient 05/23/20 1431     HPI: Stacie Harding is a 24 y.o. G2P1001 at [redacted]w[redacted]d who presents to maternity admissions reporting diffuse lower abdominal cramping, nausea and vomiting. She has had ongoing n/v with this pregnancy but it has gotten worse over the past 3 days. She has not been able to keep any food or water down. The cramping began with the onset of worsening n/v. She endorses increased discharge, but no odor, bleeding, or LOF. She endorses appropriate fetal movement.   Past Medical History:  Diagnosis Date  . COVID-19 02/17/2020  . Hypertension    OB History  Gravida Para Term Preterm AB Living  2 1 1  0 0 1  SAB IAB Ectopic Multiple Live Births  0 0 0   1    # Outcome Date GA Lbr Len/2nd Weight Sex Delivery Anes PTL Lv  2 Current           1 Term 08/05/17 [redacted]w[redacted]d 06:34 / 00:19 8 lb 0.9 oz (3.655 kg) M Vag-Spont EPI  LIV   Past Surgical History:  Procedure Laterality Date  . KNEE SURGERY     Family History  Problem Relation Age of Onset  . Diabetes Maternal Grandmother   . Hypertension Maternal Grandmother   . Sudden death Neg Hx   . Hyperlipidemia Neg Hx   . Heart attack Neg Hx    Social History   Tobacco Use  . Smoking status: Former Smoker    Quit date: 11/2016    Years since quitting: 3.5  . Smokeless tobacco: Never Used  Vaping Use  . Vaping Use: Never used  Substance Use Topics  . Alcohol use: Not Currently  . Drug use: No   Allergies  Allergen Reactions  . Iodine Nausea And Vomiting and Swelling   No medications prior to admission.    I have reviewed patient's Past Medical Hx, Surgical Hx, Family Hx, Social Hx, medications and allergies.   ROS:  Review of Systems  Constitutional: Negative.   HENT: Negative.   Eyes: Negative.   Respiratory: Negative.   Gastrointestinal: Positive for abdominal pain, nausea and vomiting.  Genitourinary: Positive  for vaginal discharge. Negative for decreased urine volume, dysuria, pelvic pain and vaginal bleeding.  Neurological: Negative for dizziness and headaches.    Physical Exam   Patient Vitals for the past 24 hrs:  BP Temp Pulse Resp SpO2 Height Weight  05/23/20 1746 (!) 99/53 -- 89 16 -- -- --  05/23/20 1408 112/69 98.2 F (36.8 C) 81 16 100 % 5\' 3"  (1.6 m) 140 lb (63.5 kg)   Constitutional: Well-developed, well-nourished female in no acute distress.  Cardiovascular: normal rate & rhythm, no murmur Respiratory: normal effort, lung sounds clear throughout GI: Abd soft, non-tender, gravid appropriate for gestational age. Pos BS x 4 MS: Extremities nontender, no edema, normal ROM Neurologic: Alert and oriented x 4.  GU: no CVA tenderness  Pelvic: NEFG, white discharge, no blood, cervix clean.   Fetal Tracing: reactive Baseline: 140 Variability: moderate Accelerations: present Decelerations: none Toco: UI   Labs: Results for orders placed or performed during the hospital encounter of 05/23/20 (from the past 24 hour(s))  Urinalysis, Routine w reflex microscopic Urine, Clean Catch     Status: Abnormal   Collection Time: 05/23/20  2:24 PM  Result Value Ref Range   Color, Urine YELLOW YELLOW   APPearance HAZY (A) CLEAR  Specific Gravity, Urine 1.015 1.005 - 1.030   pH 7.0 5.0 - 8.0   Glucose, UA NEGATIVE NEGATIVE mg/dL   Hgb urine dipstick NEGATIVE NEGATIVE   Bilirubin Urine NEGATIVE NEGATIVE   Ketones, ur 20 (A) NEGATIVE mg/dL   Protein, ur NEGATIVE NEGATIVE mg/dL   Nitrite NEGATIVE NEGATIVE   Leukocytes,Ua TRACE (A) NEGATIVE   RBC / HPF 0-5 0 - 5 RBC/hpf   WBC, UA 11-20 0 - 5 WBC/hpf   Bacteria, UA RARE (A) NONE SEEN   Squamous Epithelial / LPF 6-10 0 - 5   Mucus PRESENT   Wet prep, genital     Status: Abnormal   Collection Time: 05/23/20  2:51 PM  Result Value Ref Range   Yeast Wet Prep HPF POC NONE SEEN NONE SEEN   Trich, Wet Prep NONE SEEN NONE SEEN   Clue Cells Wet  Prep HPF POC PRESENT (A) NONE SEEN   WBC, Wet Prep HPF POC MANY (A) NONE SEEN   Sperm NONE SEEN     Imaging:  No results found.  MAU Course: Orders Placed This Encounter  Procedures  . Wet prep, genital  . Urinalysis, Routine w reflex microscopic Urine, Clean Catch  . Discharge patient   Meds ordered this encounter  Medications  . promethazine (PHENERGAN) 25 mg in sodium chloride 0.9 % 1,000 mL infusion  . famotidine (PEPCID) IVPB 20 mg premix  . Doxylamine-Pyridoxine (DICLEGIS) 10-10 MG TBEC    Sig: Take 1-2 tablets by mouth at bedtime. Start by taking two tablets at bedtime on day 1 and 2; if symptoms persist, take 1 tablet in morning and 2 tablets at bedtime on day 3; if symptoms persist, may increase to 1 tablet in morning, 1 tablet mid-afternoon, and 2 tablets at bedtime on day 4 (maximum: doxylamine 40 mg/pyridoxine 40 mg (4 tablets) per day).    Dispense:  60 tablet    Refill:  2    Order Specific Question:   Supervising Provider    Answer:   Reva Bores [[id number]]  . metroNIDAZOLE (FLAGYL) 500 MG tablet    Sig: Take 1 tablet (500 mg total) by mouth 2 (two) times daily.    Dispense:  14 tablet    Refill:  0    Order Specific Question:   Supervising Provider    Answer:   Reva Bores [[id number]]    MDM: Phenergan, pepcid and LR bolus given with good relief of n/v, pt able to tolerate sprite and crackers. Sent diclegis prescription to pharmacy  Wet prep positive for BV, sent flagyl prescription to pharmacy  Assessment: 1. Nausea/vomiting in pregnancy   2. Abdominal cramping affecting pregnancy   3. Bacterial vaginosis    Plan: Discharge home in stable condition with preterm labor precautions.     Follow-up Information    [facility] Go to.   Specialty: Obstetrics and Gynecology Why: as scheduled for ongoing prenatal care Contact information: [address] [place]  [id number] [phone]              Allergies as of 05/23/2020      Reactions   Iodine Nausea And Vomiting, Swelling      Medication List    TAKE these medications   Doxylamine-Pyridoxine 10-10 MG Tbec Commonly known as: Diclegis Take 1-2 tablets by mouth at bedtime. Start by taking two tablets at bedtime on day 1 and 2; if symptoms persist, take 1 tablet  in morning and 2 tablets at bedtime on day 3; if symptoms persist, may increase to 1 tablet in morning, 1 tablet mid-afternoon, and 2 tablets at bedtime on day 4 (maximum: doxylamine 40 mg/pyridoxine 40 mg (4 tablets) per day).   metroNIDAZOLE 500 MG tablet Commonly known as: FLAGYL Take 1 tablet (500 mg total) by mouth 2 (two) times daily.   Prenatal 27-1 MG Tabs Take 1 tablet by mouth daily.      Edd Arbour, CNM, MSN, [facility] 05/23/20 6:37 PM

## 2020-05-23 NOTE — Discharge Instructions (Signed)

## 2020-05-23 NOTE — MAU Note (Signed)
Stacie Harding is a 24 y.o. at [redacted]w[redacted]d here in MAU reporting: lower abdominal pressure and N/V since Friday . States she was out of town and called and when she returned home and was told to come in and be evaluated.   Onset of complaint: Friday Dec 17th Pain score: 7 Vitals:   05/23/20 1408  BP: 112/69  Pulse: 81  Resp: 16  Temp: 98.2 F (36.8 C)  SpO2: 100%     FHT:135 Lab orders placed from triage: UA

## 2020-05-24 LAB — GC/CHLAMYDIA PROBE AMP (~~LOC~~) NOT AT ARMC
Chlamydia: NEGATIVE
Comment: NEGATIVE
Comment: NORMAL
Neisseria Gonorrhea: NEGATIVE

## 2020-05-25 ENCOUNTER — Other Ambulatory Visit: Payer: Self-pay

## 2020-05-25 ENCOUNTER — Other Ambulatory Visit: Payer: Self-pay | Admitting: *Deleted

## 2020-05-25 ENCOUNTER — Ambulatory Visit: Payer: Medicaid Other | Attending: Obstetrics and Gynecology

## 2020-05-25 ENCOUNTER — Encounter: Payer: Self-pay | Admitting: *Deleted

## 2020-05-25 ENCOUNTER — Ambulatory Visit: Payer: Medicaid Other | Admitting: *Deleted

## 2020-05-25 DIAGNOSIS — O10913 Unspecified pre-existing hypertension complicating pregnancy, third trimester: Secondary | ICD-10-CM | POA: Diagnosis not present

## 2020-05-25 DIAGNOSIS — Z148 Genetic carrier of other disease: Secondary | ICD-10-CM | POA: Diagnosis not present

## 2020-05-25 DIAGNOSIS — Z3480 Encounter for supervision of other normal pregnancy, unspecified trimester: Secondary | ICD-10-CM | POA: Insufficient documentation

## 2020-05-25 DIAGNOSIS — Z3A32 32 weeks gestation of pregnancy: Secondary | ICD-10-CM

## 2020-05-25 DIAGNOSIS — O10919 Unspecified pre-existing hypertension complicating pregnancy, unspecified trimester: Secondary | ICD-10-CM | POA: Insufficient documentation

## 2020-05-25 DIAGNOSIS — Z362 Encounter for other antenatal screening follow-up: Secondary | ICD-10-CM | POA: Diagnosis not present

## 2020-05-30 ENCOUNTER — Telehealth: Payer: Self-pay

## 2020-05-30 ENCOUNTER — Other Ambulatory Visit: Payer: Self-pay

## 2020-05-30 ENCOUNTER — Encounter (HOSPITAL_COMMUNITY): Payer: Self-pay | Admitting: Obstetrics & Gynecology

## 2020-05-30 ENCOUNTER — Inpatient Hospital Stay (HOSPITAL_COMMUNITY)
Admission: AD | Admit: 2020-05-30 | Discharge: 2020-05-31 | Disposition: A | Discharge status: Home / Self Care | Payer: Medicaid Other | Source: Ambulatory Visit | Attending: Obstetrics & Gynecology | Admitting: Obstetrics & Gynecology

## 2020-05-30 DIAGNOSIS — O99343 Other mental disorders complicating pregnancy, third trimester: Secondary | ICD-10-CM

## 2020-05-30 DIAGNOSIS — O99891 Other specified diseases and conditions complicating pregnancy: Secondary | ICD-10-CM | POA: Diagnosis not present

## 2020-05-30 DIAGNOSIS — F515 Nightmare disorder: Secondary | ICD-10-CM | POA: Insufficient documentation

## 2020-05-30 DIAGNOSIS — O4703 False labor before 37 completed weeks of gestation, third trimester: Secondary | ICD-10-CM

## 2020-05-30 DIAGNOSIS — F514 Sleep terrors [night terrors]: Secondary | ICD-10-CM

## 2020-05-30 DIAGNOSIS — F32A Depression, unspecified: Secondary | ICD-10-CM

## 2020-05-30 DIAGNOSIS — F321 Major depressive disorder, single episode, moderate: Secondary | ICD-10-CM

## 2020-05-30 DIAGNOSIS — O99340 Other mental disorders complicating pregnancy, unspecified trimester: Secondary | ICD-10-CM

## 2020-05-30 DIAGNOSIS — Z3480 Encounter for supervision of other normal pregnancy, unspecified trimester: Secondary | ICD-10-CM

## 2020-05-30 DIAGNOSIS — R4586 Emotional lability: Secondary | ICD-10-CM | POA: Diagnosis not present

## 2020-05-30 DIAGNOSIS — Z87891 Personal history of nicotine dependence: Secondary | ICD-10-CM | POA: Insufficient documentation

## 2020-05-30 DIAGNOSIS — Z3A33 33 weeks gestation of pregnancy: Secondary | ICD-10-CM | POA: Insufficient documentation

## 2020-05-30 DIAGNOSIS — O26893 Other specified pregnancy related conditions, third trimester: Secondary | ICD-10-CM | POA: Insufficient documentation

## 2020-05-30 DIAGNOSIS — O479 False labor, unspecified: Secondary | ICD-10-CM

## 2020-05-30 NOTE — MAU Note (Signed)
Call to TTS, Stacie Harding.

## 2020-05-30 NOTE — Telephone Encounter (Signed)
Pt sent Mychart message stating she has been feeling stressed and depressed for a few months. Pt denies feelings of wanting to harm herself and states she wants to talk to someone but is not sure where to go . Pt advised to go to [facility] to be seen and to f/u at her OB appt scheduled for 06/01/20.A message was  be sent to the provider and an order   Understanding was voiced.  Aviyana Sonntag l Lilyth Lawyer, CMA

## 2020-05-30 NOTE — MAU Provider Note (Signed)
Chief Complaint:  Stress, Depression, and Abdominal Pain   Event Date/Time   First Provider Initiated Contact with Patient 05/30/20 2153     HPI: Stacie Harding is a 24 y.o. G2P1001 at 48w4dwho presents to maternity admissions reporting increased Braxton-Hicks contractions that are more painful today, as well as situational depression without suicidal ideation or intent. In the last week she has ended a relationship, lost her job, and had some unstable housing (safe, but not stable). She was seen in MAU a week ago for n/v and cramping, diagnosed with BV and prescribed flagyl and diclegis. Since she began taking diclegis she has been having nightmares and not sleeping well. She has no history of previous depression or anxiety. Denies vaginal bleeding, leaking of fluid, decreased fetal movement, fever, falls, or recent illness.   Past Medical History:  Diagnosis Date  . COVID-19 02/17/2020  . Hypertension    OB History  Gravida Para Term Preterm AB Living  _0 0 0 1  SAB IAB Ectopic Multiple Live Births  0 0 0   1    # Outcome Date GA Lbr Len/2nd Weight Sex Delivery Anes PTL Lv  2 Current           1 Term 08/05/17 350w6d6:34 / 00:19 8 lb 0.9 oz (3.655 kg) M Vag-Spont EPI  LIV   Past Surgical History:  Procedure Laterality Date  . KNEE SURGERY     Family History  Problem Relation Age of Onset  . Diabetes Maternal Grandmother   . Hypertension Maternal Grandmother   . Sudden death Neg Hx   . Hyperlipidemia Neg Hx   . Heart attack Neg Hx    Social History   Tobacco Use  . Smoking status: Former Smoker    Quit date: 11/2016    Years since quitting: 3.5  . Smokeless tobacco: Never Used  Vaping Use  . Vaping Use: Never used  Substance Use Topics  . Alcohol use: Not Currently  . Drug use: No   Allergies  Allergen Reactions  . Iodine Nausea And Vomiting and Swelling   Medications Prior to Admission  Medication Sig Dispense Refill Last Dose  . Doxylamine-Pyridoxine  (DICLEGIS) 10-10 MG TBEC Take 1-2 tablets by mouth at bedtime. Start by taking two tablets at bedtime on day 1 and 2; if symptoms persist, take 1 tablet in morning and 2 tablets at bedtime on day 3; if symptoms persist, may increase to 1 tablet in morning, 1 tablet mid-afternoon, and 2 tablets at bedtime on day 4 (maximum: doxylamine 40 mg/pyridoxine 40 mg (4 tablets) per day). 60 tablet 2 05/29/2020 at Unknown time  . metroNIDAZOLE (FLAGYL) 500 MG tablet Take 1 tablet (500 mg total) by mouth 2 (two) times daily. 14 tablet 0 05/30/2020 at Unknown time  . Prenatal 27-1 MG TABS Take 1 tablet by mouth daily.   05/30/2020 at Unknown time    I have reviewed patient's Past Medical Hx, Surgical Hx, Family Hx, Social Hx, medications and allergies.   ROS:  Review of Systems  All other systems reviewed and are negative.   Physical Exam   Patient Vitals for the past 24 hrs:  BP Temp Temp src Pulse Resp SpO2 Height Weight  05/30/20 2142 132/72 -- -- 80 16 100 % -- --  05/30/20 1800 121/71 98.3 F (36.8 C) Oral 81 17 100 % _1  (1.6 m) 138 lb 6.4 oz (62.8 kg)   Constitutional: Well-developed, well-nourished female in no acute distress.  Cardiovascular: normal rate & rhythm, no murmur Respiratory: normal effort, lung sounds clear throughout GI: Abd soft, non-tender, gravid appropriate for gestational age. Pos BS x 4 MS: Extremities nontender, no edema, normal ROM Neurologic: Alert and oriented x 4.  GU: no CVA tenderness Dilation: Closed Effacement (%): 70 Cervical Position: Posterior Station: -3 Presentation: Vertex Exam by:: Gaylan Gerold, CNM  Fetal Tracing: reactive Baseline: 135 Variability: moderate Accelerations: present Decelerations: none Toco: q3-74mn, palpating mild   Labs: No results found for this or any previous visit (from the past 24 hour(s)).  Imaging:  No results found.  MAU Course: Orders Placed This Encounter  Procedures  . Consult to TTS   Meds ordered  this encounter  Medications  . promethazine (PHENERGAN) 12.5 MG tablet    Sig: Take 1 tablet (12.5 mg total) by mouth every 6 (six) hours as needed for nausea or vomiting.    Dispense:  30 tablet    Refill:  0    Order Specific Question:   Supervising Provider    Answer:   PDonnamae Jude[[[id number]] . Magnesium Oxide (MAG-OXIDE) 200 MG TABS    Sig: Take 2 tablets (400 mg total) by mouth at bedtime. If that amount causes loose stools in the am, switch to 2089mdaily at bedtime.    Dispense:  60 tablet    Refill:  3    Order Specific Question:   Supervising Provider    Answer:   PRMerrily Pew MDM: TTS (MBeverely Lowmet with pt virtually and cleared her for outpatient follow up. Pt expressed some relief of overwhelmed feeling after speaking with MaBeverely LowResources faxed over for patient.  Discussed importance of sleep for coping skills, will switch her nausea medication and prescribe magnesium at bedtime for sleep. Pt amenable to plan.   Assessment: 1. Mood changes   2. Supervision of other normal pregnancy, antepartum   3. Nightmares   4. Braxton Hicks contractions     Plan: Discharge home in stable condition with return precautions Ambulatory referral to SW made, message sent to SW for close follow up Prescription sent to [facility] pharmacy for immediate pick up.    Allergies as of 05/31/2020      Reactions   Iodine Nausea And Vomiting, Swelling      Medication List    STOP taking these medications   Doxylamine-Pyridoxine 10-10 MG Tbec Commonly known as: Diclegis     TAKE these medications   Mag-Oxide 200 MG Tabs Generic drug: Magnesium Oxide Take 2 tablets (400 mg total) by mouth at bedtime. If that amount causes loose stools in the am, switch to 20057maily at bedtime.   metroNIDAZOLE 500 MG tablet Commonly known as: FLAGYL Take 1 tablet (500 mg total) by mouth 2 (two) times daily.   Prenatal 27-1 MG Tabs Take 1 tablet by mouth daily.   promethazine 12.5 MG  tablet Commonly known as: PHENERGAN Take 1 tablet (12.5 mg total) by mouth every 6 (six) hours as needed for nausea or vomiting.      JamGaylan GeroldNM, MSN, IBCDigestive Health Center Of Th[facility]/28/21 12:33 AM

## 2020-05-30 NOTE — MAU Note (Signed)
Pt sent her for mental health eval. Has had stress r/t life changes and depression. Also having vivid dreams.  Also has abdominal pain which she says has been going on for a week.  Denies VB,  LOF, +FM. Some BH cntrx.
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
## 2020-05-31 DIAGNOSIS — O26893 Other specified pregnancy related conditions, third trimester: Secondary | ICD-10-CM | POA: Diagnosis not present

## 2020-05-31 DIAGNOSIS — Z87891 Personal history of nicotine dependence: Secondary | ICD-10-CM | POA: Diagnosis not present

## 2020-05-31 DIAGNOSIS — O99343 Other mental disorders complicating pregnancy, third trimester: Secondary | ICD-10-CM | POA: Diagnosis not present

## 2020-05-31 DIAGNOSIS — F321 Major depressive disorder, single episode, moderate: Secondary | ICD-10-CM

## 2020-05-31 DIAGNOSIS — Z3A33 33 weeks gestation of pregnancy: Secondary | ICD-10-CM | POA: Diagnosis not present

## 2020-05-31 DIAGNOSIS — R4586 Emotional lability: Secondary | ICD-10-CM | POA: Diagnosis not present

## 2020-05-31 DIAGNOSIS — O4703 False labor before 37 completed weeks of gestation, third trimester: Secondary | ICD-10-CM | POA: Diagnosis not present

## 2020-05-31 DIAGNOSIS — O99891 Other specified diseases and conditions complicating pregnancy: Secondary | ICD-10-CM | POA: Diagnosis not present

## 2020-05-31 DIAGNOSIS — F515 Nightmare disorder: Secondary | ICD-10-CM | POA: Diagnosis not present

## 2020-05-31 MED ORDER — MAG-OXIDE 200 MG PO TABS: 400.0000 mg | ORAL_TABLET | Freq: Every day | ORAL | 3 refills | Status: DC

## 2020-05-31 MED ORDER — PROMETHAZINE HCL 12.5 MG PO TABS: 12.5000 mg | ORAL_TABLET | Freq: Four times a day (QID) | ORAL | 0 refills | Status: DC | PRN

## 2020-05-31 NOTE — Discharge Instructions (Signed)

## 2020-06-01 ENCOUNTER — Ambulatory Visit (INDEPENDENT_AMBULATORY_CARE_PROVIDER_SITE_OTHER): Payer: Medicaid Other | Admitting: Family Medicine

## 2020-06-01 ENCOUNTER — Other Ambulatory Visit: Payer: Self-pay

## 2020-06-01 VITALS — BP 117/74 | HR 84 | Wt 138.0 lb

## 2020-06-01 DIAGNOSIS — Z3A33 33 weeks gestation of pregnancy: Secondary | ICD-10-CM

## 2020-06-01 DIAGNOSIS — O285 Abnormal chromosomal and genetic finding on antenatal screening of mother: Secondary | ICD-10-CM

## 2020-06-01 DIAGNOSIS — Z3480 Encounter for supervision of other normal pregnancy, unspecified trimester: Secondary | ICD-10-CM

## 2020-06-01 DIAGNOSIS — U071 COVID-19: Secondary | ICD-10-CM

## 2020-06-01 DIAGNOSIS — O10919 Unspecified pre-existing hypertension complicating pregnancy, unspecified trimester: Secondary | ICD-10-CM

## 2020-06-01 DIAGNOSIS — F321 Major depressive disorder, single episode, moderate: Secondary | ICD-10-CM

## 2020-06-01 DIAGNOSIS — R87612 Low grade squamous intraepithelial lesion on cytologic smear of cervix (LGSIL): Secondary | ICD-10-CM

## 2020-06-01 NOTE — Progress Notes (Signed)
   PRENATAL VISIT NOTE  Subjective:  Stacie Harding is a 24 y.o. G2P1001 at [redacted]w[redacted]d being seen today for ongoing prenatal care.  She is currently monitored for the following issues for this high-risk pregnancy and has Left knee injury; LGSIL on Pap smear of cervix; Chronic hypertension affecting pregnancy; Supervision of other normal pregnancy, antepartum; Abnormal genetic test during pregnancy; Genetic carrier; COVID-19 virus infection; and Current moderate episode of major depressive disorder without prior episode (HCC) on their problem list.  Patient reports no complaints.  Contractions: Irregular. Vag. Bleeding: None.  Movement: Present. Denies leaking of fluid.   The following portions of the patient's history were reviewed and updated as appropriate: allergies, current medications, past family history, past medical history, past social history, past surgical history and problem list.   Objective:   Vitals:   06/01/20 1500  BP: 117/74  Pulse: 84  Weight: 138 lb (62.6 kg)    Fetal Status: Fetal Heart Rate (bpm): 145   Movement: Present     General:  Alert, oriented and cooperative. Patient is in no acute distress.  Skin: Skin is warm and dry. No rash noted.   Cardiovascular: Normal heart rate noted  Respiratory: Normal respiratory effort, no problems with respiration noted  Abdomen: Soft, gravid, appropriate for gestational age.  Pain/Pressure: Present     Pelvic: Cervical exam deferred        Extremities: Normal range of motion.  Edema: None  Mental Status: Normal mood and affect. Normal behavior. Normal judgment and thought content.   Assessment and Plan:  Pregnancy: G2P1001 at [redacted]w[redacted]d 1. [redacted] weeks gestation of pregnancy 2. Supervision of other normal pregnancy, antepartum FHT and FH normal  3. Chronic hypertension affecting pregnancy Controlled off medications Continue ASA 81mg   4. Abnormal genetic test during pregnancy SMA carrier  5. COVID-19 virus infection 02/17/20. No  current sequela  6. Current moderate episode of major depressive disorder without prior episode (HCC) Seen in MAU 2 days ago. More anxiety than depression currently. Taking magnesium, which she thinks is helpful. Discussed other medications available if needed.  7. LGSIL on Pap smear of cervix In 2019. Last PAP 2021 was normal Needs rpt PAP in 2022   Preterm labor symptoms and general obstetric precautions including but not limited to vaginal bleeding, contractions, leaking of fluid and fetal movement were reviewed in detail with the patient. Please refer to After Visit Summary for other counseling recommendations.   Return in about 2 weeks (around 06/15/2020) for OB f/u.  Future Appointments  Date Time Provider Department Center  06/23/2020  9:30 AM [facility] NURSE [facility]  06/23/2020  9:45 AM [facility]    06/25/2020, DO

## 2020-06-04 DIAGNOSIS — D649 Anemia, unspecified: Secondary | ICD-10-CM

## 2020-06-04 DIAGNOSIS — I10 Essential (primary) hypertension: Secondary | ICD-10-CM

## 2020-06-04 HISTORY — DX: Anemia, unspecified: D64.9

## 2020-06-04 HISTORY — DX: Essential (primary) hypertension: I10

## 2020-06-04 NOTE — L&D Delivery Note (Signed)
OB/GYN Faculty Practice Delivery Note  Stacie Harding is a 25 y.o. G2P1001 s/p vaginal delivery at [redacted]w[redacted]d. She was admitted for spontaneous onset of labor s/p SROM at 2353 on 06/25/20.  ROM: 5h 62m with clear fluid GBS Status: negative Maximum Maternal Temperature: 98.42F  Labor Progress: Pt was found to be in active labor s/p SROM at time of arrival to MAU. On admission to L&D epidural was placed per pt request. Pt noted to have complete cervical dilation at 0438. She then had an uncomplicated delivery as noted below s/p a brief second stage.  Delivery Date/Time: 06/26/20 at 0453 Delivery: Called to room and patient was complete and pushing. Head delivered LOA. No nuchal cord present. Shoulder and body delivered in usual fashion. Infant with spontaneous cry, placed on mother's abdomen, dried and stimulated. Cord clamped x 2 after 1-minute delay, and cut by FOB under my direct supervision. Cord blood drawn. Placenta delivered spontaneously with gentle cord traction. Fundus firm with massage and Pitocin. Labia, perineum, vagina, and cervix were inspected, without evidence of lacerations.   Placenta: 3-vessel cord, intact, sent to L&D Complications: none Lacerations: none; s/p arista application by Dr. Alysia Penna secondary to cervical irritation EBL: 100 ml Analgesia: epidural  Infant: female  APGARs 9 & 9  2920g  Lynnda Shields, MD OB/GYN Fellow, Faculty Practice

## 2020-06-08 ENCOUNTER — Other Ambulatory Visit: Payer: Self-pay

## 2020-06-08 ENCOUNTER — Ambulatory Visit (INDEPENDENT_AMBULATORY_CARE_PROVIDER_SITE_OTHER): Payer: Medicaid Other | Admitting: Clinical

## 2020-06-08 DIAGNOSIS — F332 Major depressive disorder, recurrent severe without psychotic features: Secondary | ICD-10-CM | POA: Diagnosis not present
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
## 2020-06-11 ENCOUNTER — Inpatient Hospital Stay (HOSPITAL_COMMUNITY)
Admission: AD | Admit: 2020-06-11 | Discharge: 2020-06-11 | Disposition: A | Discharge status: Home / Self Care | Payer: Medicaid Other | Source: Ambulatory Visit | Attending: Family Medicine | Admitting: Family Medicine

## 2020-06-11 ENCOUNTER — Encounter (HOSPITAL_COMMUNITY): Payer: Self-pay | Admitting: Family Medicine

## 2020-06-11 ENCOUNTER — Other Ambulatory Visit: Payer: Self-pay

## 2020-06-11 ENCOUNTER — Inpatient Hospital Stay (HOSPITAL_COMMUNITY)
Admission: AD | Admit: 2020-06-11 | Discharge: 2020-06-11 | Disposition: A | Discharge status: Home / Self Care | Payer: Medicaid Other | Attending: Obstetrics & Gynecology | Admitting: Obstetrics & Gynecology

## 2020-06-11 DIAGNOSIS — Z3A35 35 weeks gestation of pregnancy: Secondary | ICD-10-CM | POA: Insufficient documentation

## 2020-06-11 DIAGNOSIS — O4703 False labor before 37 completed weeks of gestation, third trimester: Secondary | ICD-10-CM | POA: Diagnosis not present

## 2020-06-11 DIAGNOSIS — O368130 Decreased fetal movements, third trimester, not applicable or unspecified: Secondary | ICD-10-CM | POA: Insufficient documentation

## 2020-06-11 DIAGNOSIS — Z8616 Personal history of COVID-19: Secondary | ICD-10-CM | POA: Insufficient documentation

## 2020-06-11 DIAGNOSIS — Z87891 Personal history of nicotine dependence: Secondary | ICD-10-CM | POA: Insufficient documentation

## 2020-06-11 DIAGNOSIS — Z3689 Encounter for other specified antenatal screening: Secondary | ICD-10-CM

## 2020-06-11 DIAGNOSIS — Z3480 Encounter for supervision of other normal pregnancy, unspecified trimester: Secondary | ICD-10-CM

## 2020-06-11 DIAGNOSIS — Z3A34 34 weeks gestation of pregnancy: Secondary | ICD-10-CM

## 2020-06-11 LAB — CBC
HCT: 35.8 % — ABNORMAL LOW (ref 36.0–46.0)
Hemoglobin: 12.7 g/dL (ref 12.0–15.0)
MCH: 32.1 pg (ref 26.0–34.0)
MCHC: 35.5 g/dL (ref 30.0–36.0)
MCV: 90.4 fL (ref 80.0–100.0)
Platelets: 199 10*3/uL (ref 150–400)
RBC: 3.96 MIL/uL (ref 3.87–5.11)
RDW: 12.7 % (ref 11.5–15.5)
WBC: 5.1 10*3/uL (ref 4.0–10.5)
nRBC: 0 % (ref 0.0–0.2)

## 2020-06-11 LAB — URINALYSIS, ROUTINE W REFLEX MICROSCOPIC
Bilirubin Urine: NEGATIVE
Glucose, UA: NEGATIVE mg/dL
Hgb urine dipstick: NEGATIVE
Ketones, ur: NEGATIVE mg/dL
Nitrite: NEGATIVE
Protein, ur: NEGATIVE mg/dL
Specific Gravity, Urine: 1.016 (ref 1.005–1.030)
WBC, UA: >50 WBC/hpf — ABNORMAL HIGH (ref 0–5)
pH: 6 (ref 5.0–8.0)

## 2020-06-11 LAB — RPR: RPR Ser Ql: NONREACTIVE

## 2020-06-11 LAB — TYPE AND SCREEN
ABO/RH(D): A POS
Antibody Screen: NEGATIVE

## 2020-06-11 LAB — GROUP B STREP BY PCR: Group B strep by PCR: NEGATIVE

## 2020-06-11 MED ORDER — BETAMETHASONE SOD PHOS & ACET 6 (3-3) MG/ML IJ SUSP
12.0000 mg | Freq: Once | INTRAMUSCULAR | Status: AC
  Administered 2020-06-11: 12 mg via INTRAMUSCULAR

## 2020-06-11 MED ORDER — LACTATED RINGERS IV BOLUS
1000.0000 mL | Freq: Once | INTRAVENOUS | Status: AC
  Administered 2020-06-11: 1000 mL via INTRAVENOUS

## 2020-06-11 MED ORDER — NIFEDIPINE 10 MG PO CAPS
10.0000 mg | ORAL_CAPSULE | ORAL | Status: AC | PRN
  Administered 2020-06-11 (×3): 10 mg via ORAL
  Filled 2020-06-11 (×3): qty 1

## 2020-06-11 MED ORDER — BETAMETHASONE SOD PHOS & ACET 6 (3-3) MG/ML IJ SUSP
12.0000 mg | Freq: Once | INTRAMUSCULAR | Status: AC
  Administered 2020-06-11: 12 mg via INTRAMUSCULAR
  Filled 2020-06-11: qty 5

## 2020-06-11 NOTE — MAU Note (Signed)
..  Stacie Harding is a 25 y.o. at [redacted]w[redacted]d returns to MAU complaining of decreased fetal movement and her second betamethasone injection. Patient states the last time she felt baby move was an hour ago. EFM was placed and patient reported increased movement. Denies contractions, vaginal bleeding or leaking of fluid.  Pain score: 0/10 Vitals:   06/11/20 1914  BP: 132/81  Pulse: 90  Resp: 18  Temp: 98.5 F (36.9 C)  SpO2: 99%

## 2020-06-11 NOTE — MAU Note (Addendum)
PT SAYS SHE HAS HAD UC ALL DAY -  BUT NOT STRONG  UNTIL 2315. [facility]  WITH  DR Adrian Blackwater. LAST SEX-  1 MTH AGO   SAYS IS NOT POSITIVE FOR COVID - WAS IN AUG

## 2020-06-11 NOTE — MAU Provider Note (Signed)
History     CSN: [id number]  Arrival date and time: 06/11/20 7412   Event Date/Time   First Provider Initiated Contact with Patient 06/11/20 0225      Chief Complaint  Patient presents with  . Contractions   Stacie Harding is a 25 y.o. G2P1001 at [redacted]w[redacted]d who receives care at [facility].  She presents today for Contractions.  Patient states she has been contracting all day, but the contractions worsened around 2300.  Patient denies vaginal bleeding or abnormal discharge.  Patient endorses fetal movement. No recent sexual activity.    OB History    Gravida  2   Para  1   Term  1   Preterm  0   AB  0   Living  1     SAB  0   IAB  0   Ectopic  0   Multiple      Live Births  1           Past Medical History:  Diagnosis Date  . COVID-19 02/17/2020  . Hypertension     Past Surgical History:  Procedure Laterality Date  . KNEE SURGERY      Family History  Problem Relation Age of Onset  . Diabetes Maternal Grandmother   . Hypertension Maternal Grandmother   . Sudden death Neg Hx   . Hyperlipidemia Neg Hx   . Heart attack Neg Hx     Social History   Tobacco Use  . Smoking status: Former Smoker    Quit date: 11/2016    Years since quitting: 3.6  . Smokeless tobacco: Never Used  Vaping Use  . Vaping Use: Never used  Substance Use Topics  . Alcohol use: Not Currently  . Drug use: No    Allergies:  Allergies  Allergen Reactions  . Iodine Nausea And Vomiting and Swelling    Medications Prior to Admission  Medication Sig Dispense Refill Last Dose  . Magnesium Oxide (MAG-OXIDE) 200 MG TABS Take 2 tablets (400 mg total) by mouth at bedtime. If that amount causes loose stools in the am, switch to 200mg  daily at bedtime. 60 tablet 3 Past Week at Unknown time  . Prenatal 27-1 MG TABS Take 1 tablet by mouth daily.   06/10/2020 at Unknown time  . metroNIDAZOLE (FLAGYL) 500 MG tablet Take 1 tablet (500 mg total) by mouth 2 (two) times daily. (Patient not taking:  Reported on 06/01/2020) 14 tablet 0   . promethazine (PHENERGAN) 12.5 MG tablet Take 1 tablet (12.5 mg total) by mouth every 6 (six) hours as needed for nausea or vomiting. 30 tablet 0 More than a month at Unknown time    Review of Systems  Gastrointestinal: Negative for nausea and vomiting.  Genitourinary: Negative for vaginal bleeding and vaginal discharge.   Physical Exam   Blood pressure 126/67, pulse 73, temperature 97.9 F (36.6 C), temperature source Oral, resp. rate 20, height 5\' 3"  (1.6 m), weight 63.9 kg, last menstrual period 10/08/2019, currently breastfeeding.  Physical Exam Constitutional:      Appearance: Normal appearance.  HENT:     Head: Normocephalic and atraumatic.  Eyes:     Conjunctiva/sclera: Conjunctivae normal.  Cardiovascular:     Rate and Rhythm: Normal rate.  Pulmonary:     Effort: Pulmonary effort is normal.  Abdominal:     Comments: Gravid, Appears SGA  Genitourinary:    Comments: Dilation: 4 Effacement (%): 50 Station: Ballotable Presentation: Vertex Exam by:: 12/08/2019 CNM  Musculoskeletal:  Cervical back: Normal range of motion.  Skin:    General: Skin is warm and dry.  Neurological:     Mental Status: She is alert and oriented to person, place, and time.  Psychiatric:        Mood and Affect: Mood normal.        Behavior: Behavior normal.        Thought Content: Thought content normal.     Fetal Assessment 150 bpm, Mod Var, -Decels, +Accels Toco: Q2-42min, palpates mild to moderate  MAU Course   Results for orders placed or performed during the hospital encounter of 06/11/20 (from the past 24 hour(s))  CBC     Status: Abnormal   Collection Time: 06/11/20  2:45 AM  Result Value Ref Range   WBC 5.1 4.0 - 10.5 K/uL   RBC 3.96 3.87 - 5.11 MIL/uL   Hemoglobin 12.7 12.0 - 15.0 g/dL   HCT 62.1 (L) 30.8 - 65.7 %   MCV 90.4 80.0 - 100.0 fL   MCH 32.1 26.0 - 34.0 pg   MCHC 35.5 30.0 - 36.0 g/dL   RDW 84.6 96.2 - 95.2 %    Platelets 199 150 - 400 K/uL   nRBC 0.0 0.0 - 0.2 %  Type and screen [facility]     Status: None   Collection Time: 06/11/20  2:45 AM  Result Value Ref Range   ABO/RH(D) A POS    Antibody Screen NEG    Sample Expiration      06/14/2020,2359 Performed at [facility], [address], [place], [place] [address]   Group B strep by PCR     Status: None   Collection Time: 06/11/20  2:49 AM   Specimen: Vaginal/Rectal; Genital  Result Value Ref Range   Group B strep by PCR NEGATIVE NEGATIVE  Urinalysis, Routine w reflex microscopic Urine, Clean Catch     Status: Abnormal   Collection Time: 06/11/20  2:57 AM  Result Value Ref Range   Color, Urine YELLOW YELLOW   APPearance HAZY (A) CLEAR   Specific Gravity, Urine 1.016 1.005 - 1.030   pH 6.0 5.0 - 8.0   Glucose, UA NEGATIVE NEGATIVE mg/dL   Hgb urine dipstick NEGATIVE NEGATIVE   Bilirubin Urine NEGATIVE NEGATIVE   Ketones, ur NEGATIVE NEGATIVE mg/dL   Protein, ur NEGATIVE NEGATIVE mg/dL   Nitrite NEGATIVE NEGATIVE   Leukocytes,Ua MODERATE (A) NEGATIVE   RBC / HPF 0-5 0 - 5 RBC/hpf   WBC, UA >50 (H) 0 - 5 WBC/hpf   Bacteria, UA RARE (A) NONE SEEN   Squamous Epithelial / LPF 0-5 0 - 5   Mucus PRESENT    No results found.  MDM PE Labs: Admit, GBS by PCR EFM Start IV with LR Bolus BMZ Dosing Assessment and Plan  25 year old G2P1001  SIUP at 35.2 weeks Cat I FT Preterm Contractions  -POC Reviewed -Exam performed and findings discussed. -Admission labs drawn and held.  -Start IV and give LR. -Procardia per Protocol. -Will monitor and reassess in 2 hours.   Cherre Robins MSN, CNM 06/11/2020, 2:31 AM    Reassessment (4:31 AM) -Exam remains unchanged. -Patient reports pain has improved with procardia dosing.  -UA with rate bacteria and moderate leuks.  Will send for culture. -Discussed discharge to home and patient agreeable.  Reviewed labor precautions and patient reports living 30 minutes away.   -Instructed to report to nearest hospital with onset of worsening contractions/symptoms. Patient  verbalizes understanding. -Plan to return to MAU on Sunday Jan 9th at 0900 for 2nd BMZ dosing. -Discharged to home in stable condition.  Cherre Robins MSN, CNM Advanced Practice Provider, [facility] [employer]

## 2020-06-11 NOTE — Discharge Instructions (Signed)

## 2020-06-11 NOTE — Discharge Instructions (Signed)
Fetal Movement Counts Patient Name: ________________________________________________ Patient Due Date: ____________________ What is a fetal movement count?  A fetal movement count is the number of times that you feel your baby move during a certain amount of time. This may also be called a fetal kick count. A fetal movement count is recommended for every pregnant woman. You may be asked to start counting fetal movements as early as week 28 of your pregnancy. Pay attention to when your baby is most active. You may notice your baby's sleep and wake cycles. You may also notice things that make your baby move more. You should do a fetal movement count:  When your baby is normally most active.  At the same time each day. A good time to count movements is while you are resting, after having something to eat and drink. How do I count fetal movements? 1. Find a quiet, comfortable area. Sit, or lie down on your side. 2. Write down the date, the start time and stop time, and the number of movements that you felt between those two times. Take this information with you to your health care visits. 3. Write down your start time when you feel the first movement. 4. Count kicks, flutters, swishes, rolls, and jabs. You should feel at least 10 movements. 5. You may stop counting after you have felt 10 movements, or if you have been counting for 2 hours. Write down the stop time. 6. If you do not feel 10 movements in 2 hours, contact your health care provider for further instructions. Your health care provider may want to do additional tests to assess your baby's well-being. Contact a health care provider if:  You feel fewer than 10 movements in 2 hours.  Your baby is not moving like he or she usually does. Date: ____________ Start time: ____________ Stop time: ____________ Movements: ____________ Date: ____________ Start time: ____________ Stop time: ____________ Movements: ____________ Date: ____________  Start time: ____________ Stop time: ____________ Movements: ____________ Date: ____________ Start time: ____________ Stop time: ____________ Movements: ____________ Date: ____________ Start time: ____________ Stop time: ____________ Movements: ____________ Date: ____________ Start time: ____________ Stop time: ____________ Movements: ____________ Date: ____________ Start time: ____________ Stop time: ____________ Movements: ____________ Date: ____________ Start time: ____________ Stop time: ____________ Movements: ____________ Date: ____________ Start time: ____________ Stop time: ____________ Movements: ____________ This information is not intended to replace advice given to you by your health care provider. Make sure you discuss any questions you have with your health care provider. Document Revised: 01/08/2019 Document Reviewed: 01/08/2019 Elsevier Patient Education  2020 [employer]. Preterm Labor and Birth Information Pregnancy normally lasts 39-41 weeks. Preterm labor is when labor starts early. It starts before you have been pregnant for 37 whole weeks. What are the risk factors for preterm labor? Preterm labor is more likely to occur in women who:  Have an infection while pregnant.  Have a cervix that is short.  Have gone into preterm labor before.  Have had surgery on their cervix.  Are younger than age 29.  Are older than age 34.  Are African American.  Are pregnant with two or more babies.  Take street drugs while pregnant.  Smoke while pregnant.  Do not gain enough weight while pregnant.  Got pregnant right after another pregnancy. What are the symptoms of preterm labor? Symptoms of preterm labor include:  Cramps. The cramps may feel like the cramps some women get during their period. The cramps may happen with watery poop (diarrhea).  Pain in the belly (abdomen).  Pain in the lower back.  Regular contractions or tightening. It may feel like your belly is  getting tighter.  Pressure in the lower belly that seems to get stronger.  More fluid (discharge) leaking from the vagina. The fluid may be watery or bloody.  Water breaking. Why is it important to notice signs of preterm labor? Babies who are born early may not be fully developed. They have a higher chance for:  Long-term heart problems.  Long-term lung problems.  Trouble controlling body systems, like breathing.  Bleeding in the brain.  A condition called cerebral palsy.  Learning difficulties.  Death. These risks are highest for babies who are born before 34 weeks of pregnancy. How is preterm labor treated? Treatment depends on:  How long you were pregnant.  Your condition.  The health of your baby. Treatment may involve:  Having a stitch (suture) placed in your cervix. When you give birth, your cervix opens so the baby can come out. The stitch keeps the cervix from opening too soon.  Staying at the hospital.  Taking or getting medicines, such as: ? Hormone medicines. ? Medicines to stop contractions. ? Medicines to help the babys lungs develop. ? Medicines to prevent your baby from having cerebral palsy. What should I do if I am in preterm labor? If you think you are going into labor too soon, call your doctor right away. How can I prevent preterm labor?  Do not use any tobacco products. ? Examples of these are cigarettes, chewing tobacco, and e-cigarettes. ? If you need help quitting, ask your doctor.  Do not use street drugs.  Do not use any medicines unless you ask your doctor if they are safe for you.  Talk with your doctor before taking any herbal supplements.  Make sure you gain enough weight.  Watch for infection. If you think you might have an infection, get it checked right away.  If you have gone into preterm labor before, tell your doctor. This information is not intended to replace advice given to you by your health care provider. Make  sure you discuss any questions you have with your health care provider. Document Revised: 09/12/2018 Document Reviewed: 10/12/2015 Elsevier Patient Education  2020 [employer].

## 2020-06-11 NOTE — MAU Provider Note (Signed)
History     CSN: [id number]  Arrival date and time: 06/11/20 1851   Event Date/Time   First Provider Initiated Contact with Patient 06/11/20 1930      Chief Complaint  Patient presents with  . Decreased Fetal Movement   Stacie Harding is a 25 y.o. G2P1001 at [redacted]w[redacted]d who presents today with decreased fetal movement. She states that for some time this evening she had not been feeling the baby move as much. However since she has been here on the monitor she is feeling normal fetal movement. She denies any contractions, VB or LOF. Of note she was here yesterday for contractions. She was 4cm at that time and was given BMZ. She is due for 2nd BMZ tomorrow as the first dose was given around 0230 today.    OB History    Gravida  2   Para  1   Term  1   Preterm  0   AB  0   Living  1     SAB  0   IAB  0   Ectopic  0   Multiple      Live Births  1           Past Medical History:  Diagnosis Date  . COVID-19 02/17/2020  . Hypertension     Past Surgical History:  Procedure Laterality Date  . KNEE SURGERY      Family History  Problem Relation Age of Onset  . Diabetes Maternal Grandmother   . Hypertension Maternal Grandmother   . Sudden death Neg Hx   . Hyperlipidemia Neg Hx   . Heart attack Neg Hx     Social History   Tobacco Use  . Smoking status: Former Smoker    Quit date: 11/2016    Years since quitting: 3.6  . Smokeless tobacco: Never Used  Vaping Use  . Vaping Use: Never used  Substance Use Topics  . Alcohol use: Not Currently  . Drug use: No    Allergies:  Allergies  Allergen Reactions  . Iodine Nausea And Vomiting and Swelling    Medications Prior to Admission  Medication Sig Dispense Refill Last Dose  . Magnesium Oxide (MAG-OXIDE) 200 MG TABS Take 2 tablets (400 mg total) by mouth at bedtime. If that amount causes loose stools in the am, switch to 200mg  daily at bedtime. 60 tablet 3 Past Week at Unknown time  . Prenatal 27-1 MG TABS Take  1 tablet by mouth daily.   Past Week at Unknown time  . promethazine (PHENERGAN) 12.5 MG tablet Take 1 tablet (12.5 mg total) by mouth every 6 (six) hours as needed for nausea or vomiting. 30 tablet 0     Review of Systems Physical Exam   Blood pressure 132/81, pulse 90, temperature 98.5 F (36.9 C), temperature source Oral, resp. rate 18, last menstrual period 10/08/2019, SpO2 99 %, currently breastfeeding.  Physical Exam Vitals and nursing note reviewed. Exam conducted with a chaperone present.  Constitutional:      General: She is not in acute distress. HENT:     Head: Normocephalic.  Eyes:     Pupils: Pupils are equal, round, and reactive to light.  Cardiovascular:     Rate and Rhythm: Normal rate.  Pulmonary:     Effort: Pulmonary effort is normal.  Abdominal:     Palpations: Abdomen is soft.     Tenderness: There is no abdominal tenderness.  Genitourinary:    Comments: Dilation: 4 Effacement (%):  50 Station: -3 Presentation: Vertex Exam by:: Zorita Pang, CNM (no change from prior exam earlier today) Neurological:     Mental Status: She is alert and oriented to person, place, and time.  Psychiatric:        Mood and Affect: Mood normal.        Behavior: Behavior normal.      NST:  Baseline: 150 Variability: moderate Accels: 15x15 Decels: none Toco: irregular, and patient denies feeling any contractions.  Reactive/Appropriate for GA   Results for orders placed or performed during the hospital encounter of 06/11/20 (from the past 24 hour(s))  CBC     Status: Abnormal   Collection Time: 06/11/20  2:45 AM  Result Value Ref Range   WBC 5.1 4.0 - 10.5 K/uL   RBC 3.96 3.87 - 5.11 MIL/uL   Hemoglobin 12.7 12.0 - 15.0 g/dL   HCT 01.6 (L) 55.3 - 74.8 %   MCV 90.4 80.0 - 100.0 fL   MCH 32.1 26.0 - 34.0 pg   MCHC 35.5 30.0 - 36.0 g/dL   RDW 27.0 78.6 - 75.4 %   Platelets 199 150 - 400 K/uL   nRBC 0.0 0.0 - 0.2 %  RPR     Status: None   Collection Time: 06/11/20   2:45 AM  Result Value Ref Range   RPR Ser Ql NON REACTIVE NON REACTIVE  Type and screen [facility]     Status: None   Collection Time: 06/11/20  2:45 AM  Result Value Ref Range   ABO/RH(D) A POS    Antibody Screen NEG    Sample Expiration      06/14/2020,2359 Performed at [facility], [address], [place], [place] [address]   Group B strep by PCR     Status: None   Collection Time: 06/11/20  2:49 AM   Specimen: Vaginal/Rectal; Genital  Result Value Ref Range   Group B strep by PCR NEGATIVE NEGATIVE  Urinalysis, Routine w reflex microscopic Urine, Clean Catch     Status: Abnormal   Collection Time: 06/11/20  2:57 AM  Result Value Ref Range   Color, Urine YELLOW YELLOW   APPearance HAZY (A) CLEAR   Specific Gravity, Urine 1.016 1.005 - 1.030   pH 6.0 5.0 - 8.0   Glucose, UA NEGATIVE NEGATIVE mg/dL   Hgb urine dipstick NEGATIVE NEGATIVE   Bilirubin Urine NEGATIVE NEGATIVE   Ketones, ur NEGATIVE NEGATIVE mg/dL   Protein, ur NEGATIVE NEGATIVE mg/dL   Nitrite NEGATIVE NEGATIVE   Leukocytes,Ua MODERATE (A) NEGATIVE   RBC / HPF 0-5 0 - 5 RBC/hpf   WBC, UA >50 (H) 0 - 5 WBC/hpf   Bacteria, UA RARE (A) NONE SEEN   Squamous Epithelial / LPF 0-5 0 - 5   Mucus PRESENT      MAU Course  Procedures  MDM DW Dr. Alysia Penna, ok to get BMZ now since it has been >12 hours since the dose she got earlier today.   Assessment and Plan   1. NST (non-stress test) reactive   2. [redacted] weeks gestation of pregnancy   3. Threatened premature labor in third trimester   4. Decreased fetal movements in third trimester, single or unspecified fetus    DC home 3rd Trimester precautions  PTL precautions  Fetal kick counts RX: none  Return to MAU as needed FU with OB as planned   Thressa Sheller DNP, CNM  06/11/20  8:28 PM

## 2020-06-15 ENCOUNTER — Other Ambulatory Visit: Payer: Self-pay

## 2020-06-15 ENCOUNTER — Ambulatory Visit (INDEPENDENT_AMBULATORY_CARE_PROVIDER_SITE_OTHER): Payer: Medicaid Other | Admitting: Obstetrics & Gynecology

## 2020-06-15 ENCOUNTER — Other Ambulatory Visit (HOSPITAL_COMMUNITY)
Admission: RE | Admit: 2020-06-15 | Discharge: 2020-06-15 | Disposition: A | Discharge status: Home / Self Care | Payer: Medicaid Other | Source: Ambulatory Visit | Attending: Obstetrics & Gynecology | Admitting: Obstetrics & Gynecology

## 2020-06-15 VITALS — BP 118/66 | HR 78 | Wt 139.0 lb

## 2020-06-15 DIAGNOSIS — Z3480 Encounter for supervision of other normal pregnancy, unspecified trimester: Secondary | ICD-10-CM | POA: Diagnosis not present

## 2020-06-15 DIAGNOSIS — O10919 Unspecified pre-existing hypertension complicating pregnancy, unspecified trimester: Secondary | ICD-10-CM

## 2020-06-15 DIAGNOSIS — O285 Abnormal chromosomal and genetic finding on antenatal screening of mother: Secondary | ICD-10-CM

## 2020-06-15 DIAGNOSIS — Z3A35 35 weeks gestation of pregnancy: Secondary | ICD-10-CM

## 2020-06-15 DIAGNOSIS — F332 Major depressive disorder, recurrent severe without psychotic features: Secondary | ICD-10-CM

## 2020-06-15 DIAGNOSIS — Z148 Genetic carrier of other disease: Secondary | ICD-10-CM

## 2020-06-15 NOTE — Progress Notes (Signed)
   PRENATAL VISIT NOTE  Subjective:  Stacie Harding is a 25 y.o. G2P1001 at [redacted]w[redacted]d being seen today for ongoing prenatal care.  She is currently monitored for the following issues for this high-risk pregnancy and has Left knee injury; LGSIL on Pap smear of cervix; Chronic hypertension affecting pregnancy; Supervision of other normal pregnancy, antepartum; Abnormal genetic test during pregnancy; Genetic carrier; COVID-19 virus infection; Current moderate episode of major depressive disorder without prior episode (HCC); and Severe episode of recurrent major depressive disorder, without psychotic features (HCC) on their problem list.  Patient reports was seen in the MAU for contractions. Received BMZ. .  Contractions: Irregular. Vag. Bleeding: None.  Movement: Present. Denies leaking of fluid.   The following portions of the patient's history were reviewed and updated as appropriate: allergies, current medications, past family history, past medical history, past social history, past surgical history and problem list.   Objective:   Vitals:   06/15/20 1450  BP: 118/66  Pulse: 78  Weight: 139 lb (63 kg)    Fetal Status: Fetal Heart Rate (bpm): 145   Movement: Present     General:  Alert, oriented and cooperative. Patient is in no acute distress.  Skin: Skin is warm and dry. No rash noted.   Cardiovascular: Normal heart rate noted  Respiratory: Normal respiratory effort, no problems with respiration noted  Abdomen: Soft, gravid, appropriate for gestational age.  Pain/Pressure: Present     Pelvic: Cervical exam performed in the presence of a chaperone        Extremities: Normal range of motion.  Edema: None  Mental Status: Normal mood and affect. Normal behavior. Normal judgment and thought content.   Assessment and Plan:  Pregnancy: G2P1001 at [redacted]w[redacted]d 1. [redacted] weeks gestation of pregnancy Cervical cx obtained today  2. Supervision of other normal pregnancy, antepartum FH and FHR WNL  3.  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 Chronic hypertension affecting pregnancy Pt taking baby ASA   5. Severe episode of recurrent major depressive disorder, without psychotic features (HCC) Pt reports that her moods are stable.   Preterm labor symptoms and general obstetric precautions including but not limited to vaginal bleeding, contractions, leaking of fluid and fetal movement were reviewed in detail with the patient. Please refer to After Visit Summary for other counseling recommendations.   Return in about 1 week (around 06/22/2020).  Future Appointments  Date Time Provider Department Center  06/23/2020  9:30 AM [facility] NURSE [facility]  06/23/2020  9:45 AM [facility] US5 [facility]  08/08/2020  2:00 PM Cozart, Neena Rhymes, LCSW [facility] None  09/05/2020  2:00 PM Cozart, Neena Rhymes, LCSW [facility] None    Willodean Rosenthal, MD

## 2020-06-15 NOTE — Patient Instructions (Signed)

## 2020-06-16 ENCOUNTER — Encounter (HOSPITAL_COMMUNITY): Payer: Self-pay | Admitting: Obstetrics and Gynecology

## 2020-06-16 ENCOUNTER — Inpatient Hospital Stay (HOSPITAL_COMMUNITY)
Admission: AD | Admit: 2020-06-16 | Discharge: 2020-06-17 | Disposition: A | Discharge status: Home / Self Care | Payer: Medicaid Other | Attending: Obstetrics and Gynecology | Admitting: Obstetrics and Gynecology

## 2020-06-16 ENCOUNTER — Other Ambulatory Visit: Payer: Self-pay

## 2020-06-16 DIAGNOSIS — Z3A36 36 weeks gestation of pregnancy: Secondary | ICD-10-CM | POA: Insufficient documentation

## 2020-06-16 DIAGNOSIS — Z3480 Encounter for supervision of other normal pregnancy, unspecified trimester: Secondary | ICD-10-CM

## 2020-06-16 DIAGNOSIS — Z87891 Personal history of nicotine dependence: Secondary | ICD-10-CM | POA: Insufficient documentation

## 2020-06-16 NOTE — MAU Note (Signed)
Pt reports worsening contractions. Good fetal movement

## 2020-06-17 DIAGNOSIS — O4703 False labor before 37 completed weeks of gestation, third trimester: Secondary | ICD-10-CM | POA: Diagnosis not present

## 2020-06-17 DIAGNOSIS — Z3A36 36 weeks gestation of pregnancy: Secondary | ICD-10-CM | POA: Diagnosis not present

## 2020-06-17 DIAGNOSIS — Z87891 Personal history of nicotine dependence: Secondary | ICD-10-CM | POA: Diagnosis not present

## 2020-06-17 LAB — GC/CHLAMYDIA PROBE AMP (~~LOC~~) NOT AT ARMC
Chlamydia: NEGATIVE
Comment: NEGATIVE
Comment: NORMAL
Neisseria Gonorrhea: NEGATIVE

## 2020-06-17 MED ORDER — TERBUTALINE SULFATE 1 MG/ML IJ SOLN: 0.2500 mg | Freq: Once | INTRAMUSCULAR | Status: AC

## 2020-06-17 MED ORDER — TERBUTALINE SULFATE 1 MG/ML IJ SOLN
INTRAMUSCULAR | Status: AC
  Administered 2020-06-17: 0.25 mg via SUBCUTANEOUS
  Filled 2020-06-17: qty 1

## 2020-06-17 MED ORDER — FENTANYL CITRATE (PF) 100 MCG/2ML IJ SOLN
50.0000 ug | Freq: Once | INTRAMUSCULAR | Status: AC
Start: 2020-06-17 — End: 2020-06-17
  Administered 2020-06-17: 50 ug via INTRAVENOUS
  Filled 2020-06-17: qty 2

## 2020-06-17 MED ORDER — NIFEDIPINE 10 MG PO CAPS
10.0000 mg | ORAL_CAPSULE | ORAL | Status: DC | PRN
  Administered 2020-06-17 (×3): 10 mg via ORAL
  Filled 2020-06-17 (×3): qty 1

## 2020-06-17 MED ORDER — LACTATED RINGERS IV SOLN: Freq: Once | INTRAVENOUS | Status: AC

## 2020-06-17 NOTE — MAU Provider Note (Signed)
Chief Complaint:  Contractions    HPI: Stacie Harding is a 25 y.o. G2P1001 at 84w1dwho presents to maternity admissions reporting painful contractions. Has been having these for a week. Cervix has been 3-4cm since 06/11/20.. She reports good fetal movement, denies LOF, vaginal bleeding, vaginal itching/burning, urinary symptoms, h/a, dizziness, n/v, diarrhea, constipation or fever/chills.  She denies headache, visual changes or RUQ abdominal pain  Abdominal Pain This is a recurrent problem. The current episode started in the past 7 days. The onset quality is gradual. The problem occurs intermittently. The problem has been unchanged. The quality of the pain is cramping. The abdominal pain does not radiate. Pertinent negatives include no constipation, diarrhea, dysuria or fever. Nothing aggravates the pain. The pain is relieved by nothing. She has tried nothing for the symptoms.   .   Past Medical History: Past Medical History:  Diagnosis Date  . COVID-19 02/17/2020  . Hypertension     Past obstetric history: OB History  Gravida Para Term Preterm AB Living  2 1 1  0 0 1  SAB IAB Ectopic Multiple Live Births  0 0 0   1    # Outcome Date GA Lbr Len/2nd Weight Sex Delivery Anes PTL Lv  2 Current           1 Term 08/05/17 [redacted]w[redacted]d 06:34 / 00:19 3655 g M Vag-Spont EPI  LIV    Past Surgical History: Past Surgical History:  Procedure Laterality Date  . KNEE SURGERY      Family History: Family History  Problem Relation Age of Onset  . Diabetes Maternal Grandmother   . Hypertension Maternal Grandmother   . Sudden death Neg Hx   . Hyperlipidemia Neg Hx   . Heart attack Neg Hx     Social History: Social History   Tobacco Use  . Smoking status: Former Smoker    Quit date: 11/2016    Years since quitting: 3.6  . Smokeless tobacco: Never Used  Vaping Use  . Vaping Use: Never used  Substance Use Topics  . Alcohol use: Not Currently  . Drug use: No    Allergies:  Allergies   Allergen Reactions  . Iodine Nausea And Vomiting and Swelling    Meds:  Medications Prior to Admission  Medication Sig Dispense Refill Last Dose  . Magnesium Oxide (MAG-OXIDE) 200 MG TABS Take 2 tablets (400 mg total) by mouth at bedtime. If that amount causes loose stools in the am, switch to 200mg  daily at bedtime. 60 tablet 3 Past Week at Unknown time  . Prenatal 27-1 MG TABS Take 1 tablet by mouth daily.   06/16/2020 at Unknown time  . Magnesium 200 MG TABS Take 1 tablet by mouth 2 (two) times daily.     . promethazine (PHENERGAN) 12.5 MG tablet Take 1 tablet (12.5 mg total) by mouth every 6 (six) hours as needed for nausea or vomiting. 30 tablet 0 More than a month at Unknown time    I have reviewed patient's Past Medical Hx, Surgical Hx, Family Hx, Social Hx, medications and allergies.   ROS:  Review of Systems  Constitutional: Negative for fever.  Gastrointestinal: Positive for abdominal pain. Negative for constipation and diarrhea.  Genitourinary: Negative for dysuria.   Other systems negative  Physical Exam   Patient Vitals for the past 24 hrs:  BP Temp Temp src Pulse Resp SpO2 Height Weight  06/17/20 0140 113/66 - - - - - - -  06/17/20 0117 140/76 - - - - - - -  06/16/20 2331 117/75 98.8 F (37.1 C) Oral 100 17 99 % 5\' 3"  (1.6 m) 64.4 kg   Constitutional: Well-developed, well-nourished female in no acute distress.  Cardiovascular: normal rate and rhythm Respiratory: normal effort, clear to auscultation bilaterally GI: Abd soft, non-tender, gravid appropriate for gestational age.   No rebound or guarding. MS: Extremities nontender, no edema, normal ROM Neurologic: Alert and oriented x 4.  GU: Neg CVAT.  PELVIC EXAM: Dilation: 3.5 Effacement (%): 60 Station: Ballotable Presentation: Vertex Exam by:: [email or web address], RN  FHT:  Baseline 150 , moderate variability, accelerations present, no decelerations Contractions: q 2-4 mins Irregular   Labs: No results found  for this or any previous visit (from the past 24 hour(s)).  --/--/A POS (01/08 0245)  Imaging:    MAU Course/MDM: NST reviewed, reactive Procardia series ordered for comfort.  Also ordered IV bolus and analgesia.  Continued to contract, so Terbutaline given for comfort This was effective in diminishing contractions.   Assessment: Single IUP at [redacted]w[redacted]d Preterm uterine contractions with no further dilation of cervix  Plan: Discharge home Labor precautions and fetal kick counts Follow up in Office for prenatal visits  Encouraged to return if she develops worsening of symptoms, increase in pain, fever, or other concerning symptoms.   Pt stable at time of discharge.  [redacted]w[redacted]d CNM, MSN Certified Nurse-Midwife 06/17/2020 2:29 AM

## 2020-06-17 NOTE — Discharge Instructions (Signed)
Preterm Labor Pregnancy normally lasts 39-41 weeks. Preterm labor is when labor starts before you have been pregnant for 37 weeks. Babies who are born too early may have problems with blood sugar, body temperature, heart, and breathing. These problems may be very serious in babies who are born before 34 weeks of pregnancy. What are the causes? The cause of this condition is not known. What increases the risk? You are more likely to have preterm labor if:  You have medical problems, now or in the past.  You have problems now or in your past pregnancies.  You have lifestyle problems. Medical history  You have problems of the womb (uterus).  You have an infection, including infections you get from sex.  You have problems that do not go away, such as: ? Blood clots. ? High blood pressure. ? High blood sugar.  You have low body weight or too much body weight. Present and past pregnancies  You have had preterm labor before.  You are pregnant with two babies or more.  You have a condition in which the placenta covers your cervix.  You waited less than 6 months between giving birth and becoming pregnant again.  Your unborn baby has some problems.  You have bleeding from your vagina.  You became pregnant by a method called IVF. Lifestyle  You smoke.  You drink alcohol.  You use drugs.  You have stress.  You have abuse in your home.  You come in contact with chemicals that harm the body (pollutants). Other factors  You are younger than 17 years or older than 35 years. What are the signs or symptoms? Symptoms of this condition include:  Cramps. The cramps may feel like cramps from a period.  You may have watery poop (diarrhea).  Pain in the belly (abdomen).  Pain in the lower back.  Regular contractions. It may feel like your belly is getting tighter.  Pressure in the lower belly.  More fluid leaking from the vagina. The fluid may be watery or  bloody.  Water breaking. How is this treated? Treatment for this condition depends on your health, the health of your baby, and how old your pregnancy is. It may include:  Taking medicines, such as: ? Hormone medicines. ? Medicines to stop contractions. ? Medicines to help mature the baby's lungs. ? Medicines to prevent your baby from getting cerebral palsy.  Bed rest. If the labor happens before 34 weeks of pregnancy, you may need to stay in the hospital.  Delivering the baby. Follow these instructions at home:  Do not use any products that contain nicotine or tobacco, such as cigarettes, e-cigarettes, and chewing tobacco. If you need help quitting, ask your doctor.  Do not drink alcohol.  Take over-the-counter and prescription medicines only as told by your doctor.  Rest as told by your doctor.  Return to your activities as told by your doctor. Ask your doctor what activities are safe for you.  Keep all follow-up visits as told by your doctor. This is important.   How is this prevented? To have a healthy pregnancy:  Do not use street drugs.  Do not use any medicines unless you ask your doctor if they are safe for you.  Talk with your doctor before taking any herbal supplements.  Make sure you gain enough weight.  Watch for infection. If you think you might have an infection, get it checked right away. Symptoms of infection may include: ? Fever. ? Vaginal discharge. ?   Pain or burning when you pee. ? Needing to pee urgently. ? Needing to pee often. ? Peeing small amounts often. ? Blood in your pee. ? Pee that smells bad or unusual.  Tell your doctor if you have gone into preterm labor before. Contact a doctor if:  You think you are going into preterm labor.  You have symptoms of preterm labor.  You have symptoms of infection. Get help right away if:  You are having painful contractions every 5 minutes or less.  Your water breaks. Summary  Preterm labor  is labor that starts before you reach 37 weeks of pregnancy.  Your baby may have problems if delivered early.  The cause of preterm labor is not known. Having problems of the womb (uterus), an infection, or bleeding during pregnancy increases the risk.  Contact a doctor if you have signs or symptoms of preterm labor. This information is not intended to replace advice given to you by your health care provider. Make sure you discuss any questions you have with your health care provider. Document Revised: 06/23/2019 Document Reviewed: 06/23/2019 Elsevier Patient Education  2021 Elsevier Inc.  

## 2020-06-22 ENCOUNTER — Inpatient Hospital Stay (HOSPITAL_COMMUNITY)
Admission: AD | Admit: 2020-06-22 | Discharge: 2020-06-22 | Disposition: A | Discharge status: Home / Self Care | Payer: Medicaid Other | Attending: Obstetrics and Gynecology | Admitting: Obstetrics and Gynecology

## 2020-06-22 ENCOUNTER — Other Ambulatory Visit: Payer: Self-pay

## 2020-06-22 ENCOUNTER — Ambulatory Visit (INDEPENDENT_AMBULATORY_CARE_PROVIDER_SITE_OTHER): Payer: Medicaid Other | Admitting: Obstetrics & Gynecology

## 2020-06-22 ENCOUNTER — Encounter: Payer: Self-pay | Admitting: Obstetrics & Gynecology

## 2020-06-22 VITALS — BP 120/81 | HR 85 | Wt 142.0 lb

## 2020-06-22 DIAGNOSIS — Z0371 Encounter for suspected problem with amniotic cavity and membrane ruled out: Secondary | ICD-10-CM | POA: Diagnosis not present

## 2020-06-22 DIAGNOSIS — Z3A36 36 weeks gestation of pregnancy: Secondary | ICD-10-CM

## 2020-06-22 DIAGNOSIS — Z3480 Encounter for supervision of other normal pregnancy, unspecified trimester: Secondary | ICD-10-CM

## 2020-06-22 DIAGNOSIS — Z8616 Personal history of COVID-19: Secondary | ICD-10-CM | POA: Diagnosis not present

## 2020-06-22 DIAGNOSIS — O26893 Other specified pregnancy related conditions, third trimester: Secondary | ICD-10-CM | POA: Insufficient documentation

## 2020-06-22 DIAGNOSIS — O10919 Unspecified pre-existing hypertension complicating pregnancy, unspecified trimester: Secondary | ICD-10-CM

## 2020-06-22 DIAGNOSIS — Z87891 Personal history of nicotine dependence: Secondary | ICD-10-CM | POA: Diagnosis not present

## 2020-06-22 DIAGNOSIS — O285 Abnormal chromosomal and genetic finding on antenatal screening of mother: Secondary | ICD-10-CM

## 2020-06-22 DIAGNOSIS — N898 Other specified noninflammatory disorders of vagina: Secondary | ICD-10-CM | POA: Diagnosis not present

## 2020-06-22 DIAGNOSIS — O479 False labor, unspecified: Secondary | ICD-10-CM

## 2020-06-22 NOTE — Progress Notes (Signed)
   PRENATAL VISIT NOTE  Subjective:  Stacie Harding is a 25 y.o. G2P1001 at [redacted]w[redacted]d being seen today for ongoing prenatal care.  She is currently monitored for the following issues for this high-risk pregnancy and has Left knee injury; LGSIL on Pap smear of cervix; Chronic hypertension affecting pregnancy; Supervision of other normal pregnancy, antepartum; Abnormal genetic test during pregnancy; Genetic carrier; COVID-19 virus infection; Current moderate episode of major depressive disorder without prior episode (HCC); and Severe episode of recurrent major depressive disorder, without psychotic features (HCC) on their problem list.  Patient reports irregular contractions.  was just in MAU overnight for concerns regarding rupture of membranes.  Contractions: Irregular. Vag. Bleeding: None.  Movement: Present. Denies leaking of fluid.   The following portions of the patient's history were reviewed and updated as appropriate: allergies, current medications, past family history, past medical history, past social history, past surgical history and problem list.   Objective:   Vitals:   06/22/20 1310  BP: 120/81  Pulse: 85  Weight: 142 lb (64.4 kg)    Fetal Status: Fetal Heart Rate (bpm): 140 Fundal Height: 37 cm Movement: Present     General:  Alert, oriented and cooperative. Patient is in no acute distress.  Skin: Skin is warm and dry. No rash noted.   Cardiovascular: Normal heart rate noted  Respiratory: Normal respiratory effort, no problems with respiration noted  Abdomen: Soft, gravid, appropriate for gestational age.  Pain/Pressure: Present     Pelvic: Cervical exam deferred        Extremities: Normal range of motion.  Edema: None  Mental Status: Normal mood and affect. Normal behavior. Normal judgment and thought content.   Assessment and Plan:  Pregnancy: G2P1001 at [redacted]w[redacted]d 1. [redacted] weeks gestation of pregnancy - On PNV - Follow up 1 week  2. Chronic hypertension affecting pregnancy -  has growth scan tomorrow at [facility] - not on any antihypertensive.  BP normal. - has not started baby ASA.  Recommended she start.    3. Abnormal genetic test during pregnancy - referred to genetic counseling  Term labor symptoms and general obstetric precautions including but not limited to vaginal bleeding, contractions, leaking of fluid and fetal movement were reviewed in detail with the patient. Please refer to After Visit Summary for other counseling recommendations.   Return in about 1 week (around 06/29/2020) for Office ob visit (MD or APP).  Future Appointments  Date Time Provider Department Center  06/23/2020  9:30 AM [facility] NURSE [facility]  06/23/2020  9:45 AM [facility] US5 [facility]  06/30/2020  1:30 PM Levie Heritage, DO [facility] None  07/07/2020  1:30 PM Levie Heritage, DO [facility] None  08/08/2020  2:00 PM Cozart, Neena Rhymes, LCSW [facility] None  09/05/2020  2:00 PM Cozart, Neena Rhymes, LCSW [facility] None    Jerene Bears, MD

## 2020-06-22 NOTE — MAU Provider Note (Signed)
Chief Complaint:  Rupture of Membranes   Event Date/Time   First Provider Initiated Contact with Patient 06/22/20 0250   HPI: Stacie Harding is a 25 y.o. G2P1001 at 72w6dwho presents to maternity admissions reporting leaking fluid.  States it started as a big gush then just wetness. . She reports good fetal movement, denies vaginal bleeding, vaginal itching/burning, urinary symptoms, h/a, dizziness, n/v, diarrhea, constipation or fever/chills. .  Vaginal Discharge The patient's primary symptoms include vaginal discharge. The patient's pertinent negatives include no genital itching, genital lesions, genital odor, pelvic pain or vaginal bleeding. This is a new problem. The current episode started today. The problem occurs intermittently. The problem has been unchanged. The patient is experiencing no pain. She is pregnant. Pertinent negatives include no abdominal pain, back pain, chills, constipation, diarrhea or fever. The vaginal discharge was clear and watery. There has been no bleeding. She has not been passing clots. She has not been passing tissue. Nothing aggravates the symptoms. She has tried nothing for the symptoms.    Patient reports to MAU for r/o rupture of membranes. Patient reports the start of leaking at 2045. Patient reports feeling fetal movements. Patient denies bleeding. Patient assessment completed. Provider to be notified  Past Medical History: Past Medical History:  Diagnosis Date  . COVID-19 02/17/2020  . Hypertension     Past obstetric history: OB History  Gravida Para Term Preterm AB Living  2 1 1  0 0 1  SAB IAB Ectopic Multiple Live Births  0 0 0   1    # Outcome Date GA Lbr Len/2nd Weight Sex Delivery Anes PTL Lv  2 Current           1 Term 08/05/17 [redacted]w[redacted]d 06:34 / 00:19 3655 g M Vag-Spont EPI  LIV    Past Surgical History: Past Surgical History:  Procedure Laterality Date  . KNEE SURGERY      Family History: Family History  Problem Relation Age of Onset   . Diabetes Maternal Grandmother   . Hypertension Maternal Grandmother   . Sudden death Neg Hx   . Hyperlipidemia Neg Hx   . Heart attack Neg Hx     Social History: Social History   Tobacco Use  . Smoking status: Former Smoker    Quit date: 11/2016    Years since quitting: 3.6  . Smokeless tobacco: Never Used  Vaping Use  . Vaping Use: Never used  Substance Use Topics  . Alcohol use: Not Currently  . Drug use: No    Allergies:  Allergies  Allergen Reactions  . Iodine Nausea And Vomiting and Swelling    Meds:  Medications Prior to Admission  Medication Sig Dispense Refill Last Dose  . Magnesium Oxide (MAG-OXIDE) 200 MG TABS Take 2 tablets (400 mg total) by mouth at bedtime. If that amount causes loose stools in the am, switch to 200mg  daily at bedtime. 60 tablet 3 06/22/2020 at Unknown time  . Prenatal 27-1 MG TABS Take 1 tablet by mouth daily.   06/22/2020 at Unknown time  . promethazine (PHENERGAN) 12.5 MG tablet Take 1 tablet (12.5 mg total) by mouth every 6 (six) hours as needed for nausea or vomiting. 30 tablet 0 Past Week at Unknown time    I have reviewed patient's Past Medical Hx, Surgical Hx, Family Hx, Social Hx, medications and allergies.   ROS:  Review of Systems  Constitutional: Negative for chills and fever.  Gastrointestinal: Negative for abdominal pain, constipation and diarrhea.  Genitourinary: Positive for  vaginal discharge. Negative for pelvic pain.  Musculoskeletal: Negative for back pain.   Other systems negative  Physical Exam   Patient Vitals for the past 24 hrs:  BP Pulse Resp  06/22/20 0223 118/74 86 17   Constitutional: Well-developed, well-nourished female in no acute distress.  Cardiovascular: normal rate and rhythm Respiratory: normal effort, clear to auscultation bilaterally GI: Abd soft, non-tender, gravid appropriate for gestational age.   No rebound or guarding. MS: Extremities nontender, no edema, normal ROM Neurologic: Alert  and oriented x 4.  GU: Neg CVAT.  PELVIC EXAM: Cervix pink, visually closed, without lesion, scant white creamy discharge, vaginal walls and external genitalia normal   NO POOLING<  NO FERNING Bimanual exam: Cervix firm, posterior, neg CMT, uterus nontender, Fundal Height consistent with dates, adnexa without tenderness, enlargement, or mass Dilation: 3 Effacement (%): 60 Station: Ballotable Exam by:: Leo Rod, RNC  FHT:  Baseline 135 , moderate variability, accelerations present, no decelerations Contractions: q 3 mins Irregular     Labs: No results found for this or any previous visit (from the past 24 hour(s)).  --/--/A POS (01/08 0245)  Imaging:    MAU Course/MDM: Reviewed negative findings with patient.   NST reviewed, reactive.  She was having regular contractions which were not painful.  So we kept her another hour and rechecked.  No change in cervix.  Treatments in MAU included EFM, SSE.    Assessment: Single IUP at [redacted]w[redacted]d Vaginal discharge, no evidence for ROM Reactive fetal heart rate pattern  Plan: Discharge home Labor precautions and fetal kick counts Follow up in Office for prenatal visits   Encouraged to return if she develops worsening of symptoms, increase in pain, fever, or other concerning symptoms.   Pt stable at time of discharge.  Wynelle Bourgeois CNM, MSN Certified Nurse-Midwife 06/22/2020 3:08 AM

## 2020-06-22 NOTE — MAU Note (Signed)
Patient reports to MAU for r/o rupture of membranes. Patient reports the start of leaking at 2045. Patient reports feeling fetal movements. Patient denies bleeding. Patient assessment completed. Provider to be notified

## 2020-06-22 NOTE — Discharge Instructions (Signed)

## 2020-06-23 ENCOUNTER — Encounter: Payer: Self-pay | Admitting: *Deleted

## 2020-06-23 ENCOUNTER — Ambulatory Visit: Payer: Medicaid Other | Admitting: *Deleted

## 2020-06-23 ENCOUNTER — Ambulatory Visit: Payer: Medicaid Other | Attending: Obstetrics and Gynecology

## 2020-06-23 DIAGNOSIS — Z3480 Encounter for supervision of other normal pregnancy, unspecified trimester: Secondary | ICD-10-CM

## 2020-06-23 DIAGNOSIS — Z148 Genetic carrier of other disease: Secondary | ICD-10-CM | POA: Diagnosis not present

## 2020-06-23 DIAGNOSIS — Z362 Encounter for other antenatal screening follow-up: Secondary | ICD-10-CM

## 2020-06-23 DIAGNOSIS — O10913 Unspecified pre-existing hypertension complicating pregnancy, third trimester: Secondary | ICD-10-CM | POA: Diagnosis present

## 2020-06-23 DIAGNOSIS — Z3A37 37 weeks gestation of pregnancy: Secondary | ICD-10-CM

## 2020-06-23 DIAGNOSIS — O10013 Pre-existing essential hypertension complicating pregnancy, third trimester: Secondary | ICD-10-CM

## 2020-06-26 ENCOUNTER — Encounter (HOSPITAL_COMMUNITY): Payer: Self-pay | Admitting: Obstetrics and Gynecology

## 2020-06-26 ENCOUNTER — Other Ambulatory Visit: Payer: Self-pay

## 2020-06-26 ENCOUNTER — Inpatient Hospital Stay (HOSPITAL_COMMUNITY): Payer: Medicaid Other | Admitting: Anesthesiology

## 2020-06-26 ENCOUNTER — Inpatient Hospital Stay (HOSPITAL_COMMUNITY)
Admission: AD | Admit: 2020-06-26 | Discharge: 2020-06-28 | DRG: 807 | Disposition: A | Discharge status: Home / Self Care | Payer: Medicaid Other | Attending: Family Medicine | Admitting: Family Medicine

## 2020-06-26 DIAGNOSIS — U071 COVID-19: Secondary | ICD-10-CM | POA: Diagnosis present

## 2020-06-26 DIAGNOSIS — Z20822 Contact with and (suspected) exposure to covid-19: Secondary | ICD-10-CM | POA: Diagnosis present

## 2020-06-26 DIAGNOSIS — O26893 Other specified pregnancy related conditions, third trimester: Secondary | ICD-10-CM | POA: Diagnosis present

## 2020-06-26 DIAGNOSIS — Z87891 Personal history of nicotine dependence: Secondary | ICD-10-CM

## 2020-06-26 DIAGNOSIS — Z3480 Encounter for supervision of other normal pregnancy, unspecified trimester: Secondary | ICD-10-CM

## 2020-06-26 DIAGNOSIS — O10919 Unspecified pre-existing hypertension complicating pregnancy, unspecified trimester: Secondary | ICD-10-CM | POA: Diagnosis present

## 2020-06-26 DIAGNOSIS — O1002 Pre-existing essential hypertension complicating childbirth: Secondary | ICD-10-CM | POA: Diagnosis present

## 2020-06-26 DIAGNOSIS — F339 Major depressive disorder, recurrent, unspecified: Secondary | ICD-10-CM | POA: Diagnosis not present

## 2020-06-26 DIAGNOSIS — F332 Major depressive disorder, recurrent severe without psychotic features: Secondary | ICD-10-CM | POA: Diagnosis present

## 2020-06-26 DIAGNOSIS — Z3A37 37 weeks gestation of pregnancy: Secondary | ICD-10-CM

## 2020-06-26 DIAGNOSIS — Z148 Genetic carrier of other disease: Secondary | ICD-10-CM | POA: Diagnosis not present

## 2020-06-26 DIAGNOSIS — N72 Inflammatory disease of cervix uteri: Secondary | ICD-10-CM | POA: Diagnosis not present

## 2020-06-26 DIAGNOSIS — O1093 Unspecified pre-existing hypertension complicating the puerperium: Secondary | ICD-10-CM | POA: Diagnosis not present

## 2020-06-26 DIAGNOSIS — Z30017 Encounter for initial prescription of implantable subdermal contraceptive: Secondary | ICD-10-CM

## 2020-06-26 DIAGNOSIS — O285 Abnormal chromosomal and genetic finding on antenatal screening of mother: Secondary | ICD-10-CM | POA: Diagnosis present

## 2020-06-26 DIAGNOSIS — Z975 Presence of (intrauterine) contraceptive device: Secondary | ICD-10-CM | POA: Diagnosis not present

## 2020-06-26 DIAGNOSIS — O9853 Other viral diseases complicating the puerperium: Secondary | ICD-10-CM | POA: Diagnosis not present

## 2020-06-26 DIAGNOSIS — F321 Major depressive disorder, single episode, moderate: Secondary | ICD-10-CM | POA: Diagnosis present

## 2020-06-26 DIAGNOSIS — O99892 Other specified diseases and conditions complicating childbirth: Secondary | ICD-10-CM

## 2020-06-26 DIAGNOSIS — O99345 Other mental disorders complicating the puerperium: Secondary | ICD-10-CM | POA: Diagnosis not present

## 2020-06-26 HISTORY — DX: Anxiety disorder, unspecified: F41.9

## 2020-06-26 HISTORY — DX: Depression, unspecified: F32.A

## 2020-06-26 HISTORY — DX: Anemia, unspecified: D64.9

## 2020-06-26 LAB — TYPE AND SCREEN
ABO/RH(D): A POS
Antibody Screen: NEGATIVE

## 2020-06-26 LAB — CBC
HCT: 32.2 % — ABNORMAL LOW (ref 36.0–46.0)
Hemoglobin: 11.5 g/dL — ABNORMAL LOW (ref 12.0–15.0)
MCH: 31.9 pg (ref 26.0–34.0)
MCHC: 35.7 g/dL (ref 30.0–36.0)
MCV: 89.2 fL (ref 80.0–100.0)
Platelets: 186 10*3/uL (ref 150–400)
RBC: 3.61 MIL/uL — ABNORMAL LOW (ref 3.87–5.11)
RDW: 12.4 % (ref 11.5–15.5)
WBC: 7.1 10*3/uL (ref 4.0–10.5)
nRBC: 0 % (ref 0.0–0.2)

## 2020-06-26 LAB — COMPREHENSIVE METABOLIC PANEL
ALT: 12 U/L (ref 0–44)
AST: 19 U/L (ref 15–41)
Albumin: 2.7 g/dL — ABNORMAL LOW (ref 3.5–5.0)
Alkaline Phosphatase: 177 U/L — ABNORMAL HIGH (ref 38–126)
Anion gap: 9 (ref 5–15)
BUN: 8 mg/dL (ref 6–20)
CO2: 22 mmol/L (ref 22–32)
Calcium: 9 mg/dL (ref 8.9–10.3)
Chloride: 106 mmol/L (ref 98–111)
Creatinine, Ser: 0.74 mg/dL (ref 0.44–1.00)
GFR, Estimated: >60 mL/min (ref >60)
Glucose, Bld: 88 mg/dL (ref 70–99)
Potassium: 3.7 mmol/L (ref 3.5–5.1)
Sodium: 137 mmol/L (ref 135–145)
Total Bilirubin: 0.7 mg/dL (ref 0.3–1.2)
Total Protein: 6.4 g/dL — ABNORMAL LOW (ref 6.5–8.1)

## 2020-06-26 LAB — POCT FERN TEST: POCT Fern Test: POSITIVE

## 2020-06-26 LAB — SARS CORONAVIRUS 2 BY RT PCR (HOSPITAL ORDER, PERFORMED IN ~~LOC~~ HOSPITAL LAB): SARS Coronavirus 2: NEGATIVE

## 2020-06-26 LAB — RPR: RPR Ser Ql: NONREACTIVE

## 2020-06-26 MED ORDER — BENZOCAINE-MENTHOL 20-0.5 % EX AERO: 1.0000 "application " | INHALATION_SPRAY | CUTANEOUS | Status: DC | PRN

## 2020-06-26 MED ORDER — LACTATED RINGERS IV SOLN
500.0000 mL | INTRAVENOUS | Status: DC | PRN
  Administered 2020-06-26: 500 mL via INTRAVENOUS

## 2020-06-26 MED ORDER — ONDANSETRON HCL 4 MG PO TABS: 4.0000 mg | ORAL_TABLET | ORAL | Status: DC | PRN

## 2020-06-26 MED ORDER — TETANUS-DIPHTH-ACELL PERTUSSIS 5-2.5-18.5 LF-MCG/0.5 IM SUSY: 0.5000 mL | PREFILLED_SYRINGE | Freq: Once | INTRAMUSCULAR | Status: DC

## 2020-06-26 MED ORDER — COCONUT OIL OIL: 1.0000 "application " | TOPICAL_OIL | Status: DC | PRN

## 2020-06-26 MED ORDER — IBUPROFEN 600 MG PO TABS
600.0000 mg | ORAL_TABLET | Freq: Four times a day (QID) | ORAL | Status: DC
  Administered 2020-06-26 – 2020-06-28 (×7): 600 mg via ORAL
  Filled 2020-06-26 (×7): qty 1

## 2020-06-26 MED ORDER — FERRIC SUBSULFATE 259 MG/GM EX SOLN
Freq: Once | CUTANEOUS | Status: DC
  Filled 2020-06-26: qty 8

## 2020-06-26 MED ORDER — OXYTOCIN BOLUS FROM INFUSION
333.0000 mL | Freq: Once | INTRAVENOUS | Status: AC
  Administered 2020-06-26: 333 mL via INTRAVENOUS

## 2020-06-26 MED ORDER — LACTATED RINGERS IV SOLN: INTRAVENOUS | Status: DC

## 2020-06-26 MED ORDER — LACTATED RINGERS IV SOLN: 500.0000 mL | Freq: Once | INTRAVENOUS | Status: DC

## 2020-06-26 MED ORDER — OXYCODONE-ACETAMINOPHEN 5-325 MG PO TABS: 2.0000 | ORAL_TABLET | ORAL | Status: DC | PRN

## 2020-06-26 MED ORDER — SENNOSIDES-DOCUSATE SODIUM 8.6-50 MG PO TABS
2.0000 | ORAL_TABLET | Freq: Every day | ORAL | Status: DC
  Administered 2020-06-27: 2 via ORAL
  Filled 2020-06-26: qty 2

## 2020-06-26 MED ORDER — PRENATAL MULTIVITAMIN CH
1.0000 | ORAL_TABLET | Freq: Every day | ORAL | Status: DC
  Administered 2020-06-26 – 2020-06-27 (×2): 1 via ORAL
  Filled 2020-06-26 (×2): qty 1

## 2020-06-26 MED ORDER — OXYCODONE-ACETAMINOPHEN 5-325 MG PO TABS: 1.0000 | ORAL_TABLET | ORAL | Status: DC | PRN

## 2020-06-26 MED ORDER — FENTANYL-BUPIVACAINE-NACL 0.5-0.125-0.9 MG/250ML-% EP SOLN
EPIDURAL | Status: AC
  Filled 2020-06-26: qty 250

## 2020-06-26 MED ORDER — ACETAMINOPHEN 325 MG PO TABS
650.0000 mg | ORAL_TABLET | Freq: Four times a day (QID) | ORAL | Status: DC
  Administered 2020-06-26 – 2020-06-28 (×8): 650 mg via ORAL
  Filled 2020-06-26 (×8): qty 2

## 2020-06-26 MED ORDER — DIPHENHYDRAMINE HCL 25 MG PO CAPS: 25.0000 mg | ORAL_CAPSULE | Freq: Four times a day (QID) | ORAL | Status: DC | PRN

## 2020-06-26 MED ORDER — SIMETHICONE 80 MG PO CHEW: 80.0000 mg | CHEWABLE_TABLET | ORAL | Status: DC | PRN

## 2020-06-26 MED ORDER — SODIUM CHLORIDE (PF) 0.9 % IJ SOLN
INTRAMUSCULAR | Status: DC | PRN
  Administered 2020-06-26: 12 mL/h via EPIDURAL

## 2020-06-26 MED ORDER — WITCH HAZEL-GLYCERIN EX PADS: 1.0000 "application " | MEDICATED_PAD | CUTANEOUS | Status: DC | PRN

## 2020-06-26 MED ORDER — OXYTOCIN-SODIUM CHLORIDE 30-0.9 UT/500ML-% IV SOLN
2.5000 [IU]/h | INTRAVENOUS | Status: DC
  Administered 2020-06-26: 2.5 [IU]/h via INTRAVENOUS
  Filled 2020-06-26: qty 500

## 2020-06-26 MED ORDER — DIBUCAINE (PERIANAL) 1 % EX OINT: 1.0000 "application " | TOPICAL_OINTMENT | CUTANEOUS | Status: DC | PRN

## 2020-06-26 MED ORDER — LIDOCAINE HCL (PF) 1 % IJ SOLN
INTRAMUSCULAR | Status: DC | PRN
  Administered 2020-06-26: 3 mL via EPIDURAL
  Administered 2020-06-26: 5 mL via EPIDURAL
  Administered 2020-06-26: 2 mL via EPIDURAL

## 2020-06-26 MED ORDER — ONDANSETRON HCL 4 MG/2ML IJ SOLN: 4.0000 mg | INTRAMUSCULAR | Status: DC | PRN

## 2020-06-26 MED ORDER — ONDANSETRON HCL 4 MG/2ML IJ SOLN: 4.0000 mg | Freq: Four times a day (QID) | INTRAMUSCULAR | Status: DC | PRN

## 2020-06-26 MED ORDER — LIDOCAINE HCL (PF) 1 % IJ SOLN: 30.0000 mL | INTRAMUSCULAR | Status: DC | PRN

## 2020-06-26 MED ORDER — ACETAMINOPHEN 325 MG PO TABS: 650.0000 mg | ORAL_TABLET | ORAL | Status: DC | PRN

## 2020-06-26 MED ORDER — SOD CITRATE-CITRIC ACID 500-334 MG/5ML PO SOLN: 30.0000 mL | ORAL | Status: DC | PRN

## 2020-06-26 NOTE — H&P (Signed)
OBSTETRIC ADMISSION HISTORY AND PHYSICAL  Stacie Harding is a 25 y.o. female G2P1001 with IUP at [redacted]w[redacted]d by L/6 presenting for spontaneous onset of labor s/p SROM at 2353 on 06/25/20. She reports +FMs, no VB, no blurry vision, headaches or peripheral edema, and RUQ pain.  She plans on breast and bottle feeding. She requests inpatient Nexplanon for birth control.  She received her prenatal care at [facility]   Dating: By L/6 --->  Estimated Date of Delivery: 07/14/20  Sono:  @[redacted]w[redacted]d , CWD, normal anatomy, cephalic presentation, 2874g, 09-14-1991 EFW   Prenatal History/Complications:  - h/o COVID in pregnancy (02/17/20) - cHTN (no medications)  Past Medical History: Past Medical History:  Diagnosis Date  . Anemia   . Anxiety   . COVID-19 02/17/2020  . Depression   . Hypertension     Past Surgical History: Past Surgical History:  Procedure Laterality Date  . KNEE SURGERY      Obstetrical History: OB History    Gravida  2   Para  1   Term  1   Preterm  0   AB  0   Living  1     SAB  0   IAB  0   Ectopic  0   Multiple      Live Births  1           Social History Social History   Socioeconomic History  . Marital status: Single    Spouse name: Not on file  . Number of children: Not on file  . Years of education: Not on file  . Highest education level: Not on file  Occupational History  . Not on file  Tobacco Use  . Smoking status: Former Smoker    Quit date: 11/2016    Years since quitting: 3.6  . Smokeless tobacco: Never Used  Vaping Use  . Vaping Use: Never used  Substance and Sexual Activity  . Alcohol use: Not Currently  . Drug use: No  . Sexual activity: Not Currently    Birth control/protection: None  Other Topics Concern  . Not on file  Social History Narrative   ** Merged History Encounter **       Social Determinants of Health   Financial Resource Strain: Not on file  Food Insecurity: Not on file  Transportation Needs: Not on file   Physical Activity: Not on file  Stress: Not on file  Social Connections: Not on file    Family History: Family History  Problem Relation Age of Onset  . Diabetes Maternal Grandmother   . Hypertension Maternal Grandmother   . Sudden death Neg Hx   . Hyperlipidemia Neg Hx   . Heart attack Neg Hx     Allergies: Allergies  Allergen Reactions  . Iodine Nausea And Vomiting and Swelling    Medications Prior to Admission  Medication Sig Dispense Refill Last Dose  . Prenatal 27-1 MG TABS Take 1 tablet by mouth daily.   06/25/2020 at Unknown time  . Magnesium Oxide (MAG-OXIDE) 200 MG TABS Take 2 tablets (400 mg total) by mouth at bedtime. If that amount causes loose stools in the am, switch to 200mg  daily at bedtime. (Patient not taking: Reported on 06/22/2020) 60 tablet 3   . promethazine (PHENERGAN) 12.5 MG tablet Take 1 tablet (12.5 mg total) by mouth every 6 (six) hours as needed for nausea or vomiting. (Patient not taking: Reported on 06/22/2020) 30 tablet 0      Review of Systems  All systems reviewed and negative except as stated in HPI  Blood pressure (!) 121/95, pulse 89, temperature 98.3 F (36.8 C), temperature source Oral, resp. rate 16, height 5\' 3"  (1.6 m), weight 65.4 kg, last menstrual period 10/08/2019, SpO2 99 %, currently breastfeeding. General appearance: alert, cooperative and appears stated age Lungs: normal WOB Heart: regular rate Abdomen: soft, non-tender Extremities: no sign of DVT Presentation: cephalic Fetal monitoringBaseline: 145 bpm, Variability: Good {> 6 bpm), Accelerations: Reactive and Decelerations: Absent Uterine activityFrequency: Every 2-3 minutes Dilation: 10 Effacement (%): 100 Station: Plus 2 Exam by:: Dr [email or web address]  Prenatal labs: ABO, Rh: --/--/A POS (01/23 0140) Antibody: NEG (01/23 0140) Rubella: 11.60 (07/06 1026) RPR: NON REACTIVE (01/08 0245)  HBsAg: Negative (07/06 1026)  HIV: Non Reactive (12/14 0930)  GBS: NEGATIVE/-- (01/08  0249)  1 hr Glucola wnl Genetic screening 2 copies of SMN1 Anatomy [birth year]1989 wnl  Prenatal Transfer Tool  Maternal Diabetes: No Genetic Screening:  2 copies of SMN1 Maternal Ultrasounds/Referrals: Normal Fetal Ultrasounds or other Referrals:  Referred to Materal Fetal Medicine given cHTN Maternal Substance Abuse:  No Significant Maternal Medications:  None Significant Maternal Lab Results: Group B Strep negative  Results for orders placed or performed during the hospital encounter of 06/26/20 (from the past 24 hour(s))  Fern Test   Collection Time: 06/26/20  1:36 AM  Result Value Ref Range   POCT Fern Test Positive = ruptured amniotic membanes   Type and screen [facility]   Collection Time: 06/26/20  1:40 AM  Result Value Ref Range   ABO/RH(D) A POS    Antibody Screen NEG    Sample Expiration      06/29/2020,2359 Performed at [facility], [address], [place], [place]   SARS Coronavirus 2 by RT PCR (hospital order, performed in [facility] hospital lab) Nasopharyngeal Nasopharyngeal Swab   Collection Time: 06/26/20  2:06 AM   Specimen: Nasopharyngeal Swab  Result Value Ref Range   SARS Coronavirus 2 NEGATIVE NEGATIVE  CBC   Collection Time: 06/26/20  2:06 AM  Result Value Ref Range   WBC 7.1 4.0 - 10.5 K/uL   RBC 3.61 (L) 3.87 - 5.11 MIL/uL   Hemoglobin 11.5 (L) 12.0 - 15.0 g/dL   HCT 06/28/20 (L) 82.9 - 56.2 %   MCV 89.2 80.0 - 100.0 fL   MCH 31.9 26.0 - 34.0 pg   MCHC 35.7 30.0 - 36.0 g/dL   RDW 13.0 86.5 - 78.4 %   Platelets 186 150 - 400 K/uL   nRBC 0.0 0.0 - 0.2 %    Patient Active Problem List   Diagnosis Date Noted  . Indication for care in labor or delivery 06/26/2020  . Severe episode of recurrent major depressive disorder, without psychotic features (HCC) 06/08/2020  . Current moderate episode of major depressive disorder without prior episode (HCC)   . COVID-19 virus infection 02/18/2020  . Genetic carrier 01/05/2020  .  Abnormal genetic test during pregnancy 12/16/2019  . Chronic hypertension affecting pregnancy 12/08/2019  . Supervision of other normal pregnancy, antepartum 12/08/2019  . LGSIL on Pap smear of cervix 09/13/2017  . Left knee injury 02/21/2012    Assessment/Plan:  Shonice Wrisley is a 25 y.o. G2P1001 at [redacted]w[redacted]d here for SOL s/p SROM at 2353 on 06/25/20.  #Labor: Will manage expectantly and augment as clinically indicated. #Pain: plan for epidural per pt request #FWB:  Category 1 strip #ID: GBS neg #MOF: breast and bottle #MOC: plan  for inpatient nexplanon; pt consented on admission #Circ: n/a #cHTN: no medications in pregnancy. Asymptomatic on admission. F/u preeclampsia labs on admission. #2 Copies of SMN1: peds aware  Aceson Labell, Skipper Cliche, MD OB Fellow, [facility] 06/26/2020 7:05 AM

## 2020-06-26 NOTE — Anesthesia Postprocedure Evaluation (Signed)
Anesthesia Post Note  Patient: Stacie Harding  Procedure(s) Performed: AN AD HOC LABOR EPIDURAL     Anesthesia Type: Epidural Anesthetic complications: no   No complications documented.  Last Vitals:  Vitals:   06/26/20 0723 06/26/20 0809  BP: 120/85 116/83  Pulse: 81 72  Resp: 18 16  Temp: 36.9 C 36.7 C  SpO2:  99%    Last Pain:  Vitals:   06/26/20 0809  TempSrc: Oral  PainSc: 0-No pain   Pain Goal:                   [employer]

## 2020-06-26 NOTE — MAU Note (Signed)
SROM at 2353 of reddish-brown fluid.  Contractions feel close but hasn't timed them.  Endorses + FM.  Denies any frank VB.

## 2020-06-26 NOTE — Anesthesia Procedure Notes (Addendum)
Epidural Patient location during procedure: OB Start time: 06/26/2020 2:48 AM End time: 06/26/2020 2:55 AM  Staffing Anesthesiologist: Cecile Hearing, MD Performed: anesthesiologist   Preanesthetic Checklist Completed: patient identified, IV checked, risks and benefits discussed, monitors and equipment checked, pre-op evaluation and timeout performed  Epidural Patient position: sitting Prep: ChloraPrep Patient monitoring: blood pressure and continuous pulse ox Approach: midline Location: L3-L4 Injection technique: LOR air  Needle:  Needle type: Tuohy  Needle gauge: 17 G Needle length: 9 cm Needle insertion depth: 5 cm Catheter size: 19 Gauge Catheter at skin depth: 10 cm Test dose: negative and Other (1% Lidocaine)  Additional Notes Patient identified.  Risk benefits discussed including failed block, incomplete pain control, headache, nerve damage, paralysis, blood pressure changes, nausea, vomiting, reactions to medication both toxic or allergic, and postpartum back pain.  Patient expressed understanding and wished to proceed.  All questions were answered.  Sterile technique used throughout procedure and epidural site dressed with sterile barrier dressing. No paresthesia or other complications noted. The patient did not experience any signs of intravascular injection such as tinnitus or metallic taste in mouth nor signs of intrathecal spread such as rapid motor block. Please see nursing notes for vital signs. Reason for block:procedure for pain

## 2020-06-26 NOTE — Discharge Instructions (Signed)

## 2020-06-26 NOTE — Discharge Summary (Signed)
Postpartum Discharge Summary      Patient Name: Stacie Harding DOB: 10-24-1995 MRN: [id number]  Date of admission: 06/26/2020 Delivery date:06/26/2020  Delivering provider: Randa Ngo  Date of discharge: 06/28/2020  Admitting diagnosis: Indication for care in labor or delivery [O75.9] Intrauterine pregnancy: [redacted]w[redacted]d    Secondary diagnosis:  Principal Problem:   Vaginal delivery Active Problems:   Chronic hypertension affecting pregnancy   Abnormal genetic test during pregnancy   Genetic carrier   COVID-19 virus infection   Current moderate episode of major depressive disorder without prior episode ([place]   Severe episode of recurrent major depressive disorder, without psychotic features ([place]   Indication for care in labor or delivery   Nexplanon insertion  Additional problems: as noted above  Discharge diagnosis: Term Pregnancy Delivered                                              Post partum procedures: postpartum Nexplanon placed 06/27/2020 Augmentation: none Complications: None  Hospital course: Onset of Labor With Vaginal Delivery      25y.o. yo [id number] 365w3das admitted in Active Labor on 06/26/2020. Patient had an uncomplicated labor course as follows:  Membrane Rupture Time/Date: 11:53 PM ,06/25/2020   Delivery Method:Vaginal, Spontaneous  Episiotomy: None  Lacerations:  None  Patient had an uncomplicated postpartum course.  She is ambulating, tolerating a regular diet, passing flatus, and urinating well. Patient is discharged home in stable condition on 06/28/20.  Newborn Data: Birth date:06/26/2020  Birth time:4:53 AM  Gender:Female  Living status:Living  Apgars:9 ,9  Weight:2920 g   Magnesium Sulfate received: No BMZ received: No Rhophylac:N/A MMR:N/A T-DaP:declined Flu: No Transfusion:No  Physical exam  Vitals:   06/26/20 2236 06/27/20 0500 06/27/20 1405 06/27/20 2050  BP: 123/68 105/66 110/72 114/73  Pulse: 90 74 73 80  Resp:  20 16 18    Temp: 98.1 F (36.7 C) 97.6 F (36.4 C) 98.1 F (36.7 C) 98.4 F (36.9 C)  TempSrc: Oral Oral Oral Oral  SpO2: 100%   100%  Weight:      Height:       General: alert, cooperative and no distress Lochia: appropriate Uterine Fundus: firm Incision: N/A DVT Evaluation: No evidence of DVT seen on physical exam. Negative Homan's sign. No cords or calf tenderness. Labs: Lab Results  Component Value Date   WBC 6.4 06/27/2020   HGB 10.1 (L) 06/27/2020   HCT 28.9 (L) 06/27/2020   MCV 90.3 06/27/2020   PLT 180 06/27/2020   CMP Latest Ref Rng & Units 06/26/2020  Glucose 70 - 99 mg/dL 88  BUN 6 - 20 mg/dL 8  Creatinine 0.44 - 1.00 mg/dL 0.74  Sodium 135 - 145 mmol/L 137  Potassium 3.5 - 5.1 mmol/L 3.7  Chloride 98 - 111 mmol/L 106  CO2 22 - 32 mmol/L 22  Calcium 8.9 - 10.3 mg/dL 9.0  Total Protein 6.5 - 8.1 g/dL 6.4(L)  Total Bilirubin 0.3 - 1.2 mg/dL 0.7  Alkaline Phos 38 - 126 U/L 177(H)  AST 15 - 41 U/L 19  ALT 0 - 44 U/L 12   Edinburgh Score: Edinburgh Postnatal Depression Scale Screening Tool 06/26/2020  I have been able to laugh and see the funny side of things. 0  I have looked forward with enjoyment to things. 1  I have blamed myself  unnecessarily when things went wrong. 1  I have been anxious or worried for no good reason. 2  I have felt scared or panicky for no good reason. 2  Things have been getting on top of me. 1  I have been so unhappy that I have had difficulty sleeping. 2  I have felt sad or miserable. 1  I have been so unhappy that I have been crying. 1  The thought of harming myself has occurred to me. 0  Edinburgh Postnatal Depression Scale Total 11     After visit meds:  Allergies as of 06/28/2020      Reactions   Iodine Nausea And Vomiting, Swelling      Medication List    STOP taking these medications   Mag-Oxide 200 MG Tabs Generic drug: Magnesium Oxide   promethazine 12.5 MG tablet Commonly known as: PHENERGAN     TAKE these  medications   ibuprofen 600 MG tablet Commonly known as: ADVIL Take 1 tablet (600 mg total) by mouth every 6 (six) hours.   Prenatal 27-1 MG Tabs Take 1 tablet by mouth daily.         Discharge home in stable condition Infant Feeding: breast & bottle Infant Disposition:home with mother Discharge instruction: per After Visit Summary and Postpartum booklet. Activity: Advance as tolerated. Pelvic rest for 6 weeks.  Diet: routine diet Future Appointments: Future Appointments  Date Time Provider [place]  07/04/2020  1:30 PM [facility] NURSE [facility] None  08/04/2020  2:15 PM Truett Mainland, DO [facility] None  08/08/2020  2:00 PM Co[place]art, Birdena Jubilee, LCSW [facility] None  09/05/2020  2:00 PM Cozart, Birdena Jubilee, LCSW [facility] None   Follow up Visit:  [place] Follow up.   Specialty: Obstetrics and Gynecology Why: 1/31 for BP check (can be virtual) and 3/3 for postpartum checkup Contact information: [place] [address] [phone]             Message sent to [facility] clinic on 06/26/20.  Please schedule this patient for a In person postpartum visit in 6 weeks with the following provider: Any provider. Additional Postpartum F/U:Postpartum Depression checkup and BP check 1 week High risk pregnancy complicated by: cHTN, recurrent MDD, COVID infection in pregnancy (diagnosed 02/17/20) Delivery mode:  Vaginal, Spontaneous  Anticipated Birth Control:  PP Nexplanon placed 06/27/20  06/28/2020 Christin Fudge, CNM

## 2020-06-26 NOTE — MAU Note (Signed)
Patient reports to MAU for r/o rupture of membranes. Patient stated water broke at 2353 initial fluid was bloody and is currently clear. Patient reports positive fetal movements. Patient denies fevers. Triage completed. Provider to be notified

## 2020-06-26 NOTE — Anesthesia Preprocedure Evaluation (Signed)
Anesthesia Evaluation  Patient identified by MRN, date of birth, ID band Patient awake    Reviewed: Allergy & Precautions, NPO status , Patient's Chart, lab work & pertinent test results  Airway Mallampati: II  TM Distance: >3 FB Neck ROM: Full    Dental  (+) Teeth Intact, Dental Advisory Given   Pulmonary former smoker,    Pulmonary exam normal breath sounds clear to auscultation       Cardiovascular hypertension (GHTN), Normal cardiovascular exam Rhythm:Regular Rate:Normal     Neuro/Psych PSYCHIATRIC DISORDERS Anxiety Depression negative neurological ROS     GI/Hepatic negative GI ROS, Neg liver ROS,   Endo/Other  negative endocrine ROS  Renal/GU negative Renal ROS     Musculoskeletal negative musculoskeletal ROS (+)   Abdominal   Peds  Hematology  (+) Blood dyscrasia, anemia , Plt 186k   Anesthesia Other Findings Day of surgery medications reviewed with the patient.  Reproductive/Obstetrics (+) Pregnancy                             Anesthesia Physical Anesthesia Plan  ASA: III  Anesthesia Plan: Epidural   Post-op Pain Management:    Induction:   PONV Risk Score and Plan: 2 and Treatment may vary due to age or medical condition  Airway Management Planned: Natural Airway  Additional Equipment:   Intra-op Plan:   Post-operative Plan:   Informed Consent: I have reviewed the patients History and Physical, chart, labs and discussed the procedure including the risks, benefits and alternatives for the proposed anesthesia with the patient or authorized representative who has indicated his/her understanding and acceptance.     Dental advisory given  Plan Discussed with:   Anesthesia Plan Comments: (Patient identified. Risks/Benefits/Options discussed with patient including but not limited to bleeding, infection, nerve damage, paralysis, failed block, incomplete pain control,  headache, blood pressure changes, nausea, vomiting, reactions to medication both or allergic, itching and postpartum back pain. Confirmed with bedside nurse the patient's most recent platelet count. Confirmed with patient that they are not currently taking any anticoagulation, have any bleeding history or any family history of bleeding disorders. Patient expressed understanding and wished to proceed. All questions were answered. )        Anesthesia Quick Evaluation

## 2020-06-27 DIAGNOSIS — Z30017 Encounter for initial prescription of implantable subdermal contraceptive: Secondary | ICD-10-CM

## 2020-06-27 LAB — CBC
HCT: 28.9 % — ABNORMAL LOW (ref 36.0–46.0)
Hemoglobin: 10.1 g/dL — ABNORMAL LOW (ref 12.0–15.0)
MCH: 31.6 pg (ref 26.0–34.0)
MCHC: 34.9 g/dL (ref 30.0–36.0)
MCV: 90.3 fL (ref 80.0–100.0)
Platelets: 180 10*3/uL (ref 150–400)
RBC: 3.2 MIL/uL — ABNORMAL LOW (ref 3.87–5.11)
RDW: 12.5 % (ref 11.5–15.5)
WBC: 6.4 10*3/uL (ref 4.0–10.5)
nRBC: 0 % (ref 0.0–0.2)

## 2020-06-27 MED ORDER — LIDOCAINE HCL 1 % IJ SOLN
0.0000 mL | Freq: Once | INTRAMUSCULAR | Status: AC | PRN
  Administered 2020-06-27: 20 mL via INTRADERMAL
  Filled 2020-06-27: qty 20

## 2020-06-27 MED ORDER — ETONOGESTREL 68 MG ~~LOC~~ IMPL
68.0000 mg | DRUG_IMPLANT | Freq: Once | SUBCUTANEOUS | Status: AC
  Administered 2020-06-27: 68 mg via SUBCUTANEOUS
  Filled 2020-06-27: qty 1

## 2020-06-27 NOTE — Progress Notes (Signed)
Post Partum Day 1 S/p SVD 06/26/2020 at 0453  Subjective: no complaints, up ad lib, voiding, tolerating PO and + flatus  Objective: Blood pressure 105/66, pulse 74, temperature 97.6 F (36.4 C), temperature source Oral, resp. rate 20, height 5\' 3"  (1.6 m), weight 65.4 kg, last menstrual period 10/08/2019, SpO2 100 %, unknown if currently breastfeeding.  Physical Exam:  General: alert, cooperative, appears stated age and no distress Lochia: appropriate Uterine Fundus: firm Incision: N/A DVT Evaluation: No evidence of DVT seen on physical exam.  Recent Labs    06/26/20 0206 06/27/20 0438  HGB 11.5* 10.1*  HCT 32.2* 28.9*    Assessment/Plan: Patient desires inpatient Nexplanon, [place]y RN notified of request at [phone] CNM will return to bedside to place Nexplanon when RN available Plan for discharge tomorrow      LOS: 1 day   3403, CNM 06/27/2020, 9:12 AM

## 2020-06-27 NOTE — Social Work (Signed)
CSW consulted for history of anxiety, MDD and score of 11 on Edinburgh scale. CSW met with MOB to assess and provide support. CSW observed MOB holding newborn and FOB sleeping on couch. CSW introduced self and role and offered to return to speak with MOB privately. MOB declined and stated FOB could remain in room. CSW informed MOB of reason for consult. MOB expressed understanding. MOB reported she is currently feeling okay. MOB denies every being diagnosed with anxiety or depression. MOB reported she experienced some anxiety during the pregnancy and was prescribed Magnesium, which was helpful. MOB disclosed she currently attends therapy at the [facility] and has another appointment in March. MOB finds both interventions to be helpful. MOB identified FOB and her mother as supports and denies any current SI, HI or DV. MOB attributes the [place] score to stressors experienced during pregnancy, and reiterated she is doing okay.   CSW provided education regarding the baby blues period vs. perinatal mood disorders, discussed treatment and gave resources for mental health follow up if concerns arise.  CSW recommends self-evaluation during the postpartum time period using the New Mom Checklist from Postpartum Progress and encouraged MOB to contact a medical professional if symptoms are noted at any time.   CSW provided review of Sudden Infant Death Syndrome (SIDS) precautions.  MOB reported she has everything needed for baby, including a crib. MOB identified Dr. Audelia Hives with [facility] for follow-up care and denies any transportation barriers. MOB denied having any additional needs and declined nay referrals at this time.   CSW identifies no further need for intervention and no barriers to discharge at this time.  Darra Lis, [place] Work [employer] [phone]

## 2020-06-27 NOTE — Procedures (Signed)
     INPATIENT PROCEDURE NOTE  Stacie Harding is a 25 y.o. [id number] who desires Nexplanon placement while inpatient s/p SVD  Nexplanon Insertion Procedure Patient identified, informed consent performed, consent signed.   Patient does understand that irregular bleeding is a very common side effect of this medication. She was advised to have backup contraception for one week after placement. Appropriate time out taken.  Patient's right arm was prepped and draped in the usual sterile fashion. The ruler used to measure and mark insertion area.  Patient was prepped with alcohol swab and then injected with 3 ml of 1% lidocaine.  She was prepped with betadine, Nexplanon removed from packaging,  Device confirmed in needle, then inserted full length of needle and withdrawn per handbook instructions. Nexplanon was able to palpated in the patient's arm; patient palpated the insert herself. There was minimal blood loss.  Patient insertion site covered with guaze and a pressure bandage to reduce any bruising.  The patient tolerated the procedure well and was given post procedure instructions.   Clayton Bibles, MSN, CNM Certified Nurse Midwife, [facility] for [employer], [facility] 06/27/20 10:54 AM

## 2020-06-28 DIAGNOSIS — U071 COVID-19: Secondary | ICD-10-CM

## 2020-06-28 DIAGNOSIS — O1093 Unspecified pre-existing hypertension complicating the puerperium: Secondary | ICD-10-CM

## 2020-06-28 DIAGNOSIS — O99345 Other mental disorders complicating the puerperium: Secondary | ICD-10-CM

## 2020-06-28 DIAGNOSIS — Z975 Presence of (intrauterine) contraceptive device: Secondary | ICD-10-CM

## 2020-06-28 DIAGNOSIS — Z148 Genetic carrier of other disease: Secondary | ICD-10-CM

## 2020-06-28 DIAGNOSIS — O9853 Other viral diseases complicating the puerperium: Secondary | ICD-10-CM

## 2020-06-28 DIAGNOSIS — F339 Major depressive disorder, recurrent, unspecified: Secondary | ICD-10-CM

## 2020-06-28 MED ORDER — IBUPROFEN 600 MG PO TABS: 600.0000 mg | ORAL_TABLET | Freq: Four times a day (QID) | ORAL | 0 refills | Status: DC

## 2020-06-30 ENCOUNTER — Encounter: Payer: Medicaid Other | Admitting: Family Medicine

## 2020-07-04 ENCOUNTER — Ambulatory Visit: Payer: Medicaid Other

## 2020-07-07 ENCOUNTER — Encounter: Payer: Medicaid Other | Admitting: Family Medicine

## 2020-08-04 ENCOUNTER — Ambulatory Visit: Payer: Medicaid Other | Admitting: Family Medicine

## 2020-08-08 ENCOUNTER — Ambulatory Visit (HOSPITAL_COMMUNITY): Payer: Medicaid Other | Admitting: Clinical

## 2020-09-05 ENCOUNTER — Ambulatory Visit (HOSPITAL_COMMUNITY): Payer: Self-pay | Admitting: Clinical

## 2020-11-12 IMAGING — DX PORTABLE CHEST - 1 VIEW
1 series · 1 of 1 positions shown · non-contrast
Comparison: January 16, 2009

CLINICAL DATA: Cough, fever and shortness of breath. Covid testing
done today.

EXAM:
PORTABLE CHEST 1 VIEW

[chest ap]
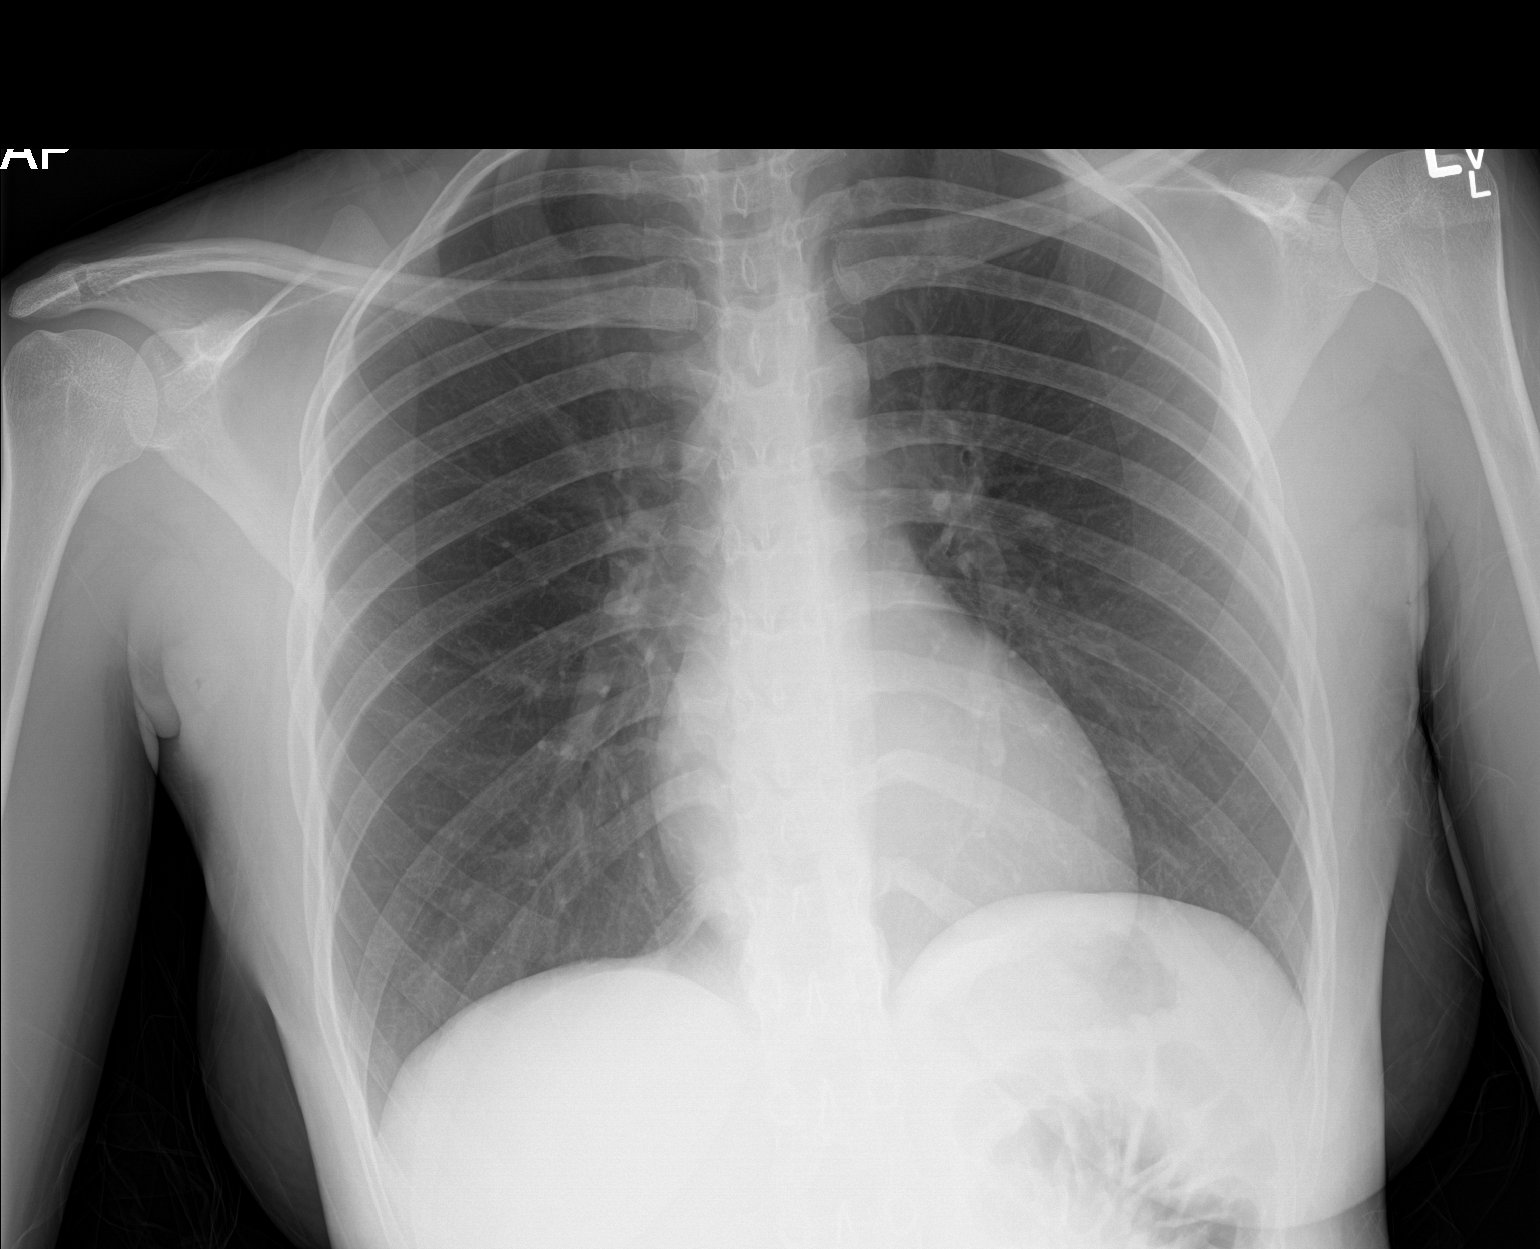

[1 of 1 positions shown; findings below may reference images not displayed]

FINDINGS: The heart size and mediastinal contours are within normal limits.
Both lungs are clear. The visualized skeletal structures are
unremarkable.
IMPRESSION: No acute cardiopulmonary process.

## 2020-11-29 ENCOUNTER — Emergency Department (HOSPITAL_COMMUNITY): Payer: Medicaid Other

## 2020-11-29 ENCOUNTER — Emergency Department (HOSPITAL_COMMUNITY)
Admission: EM | Admit: 2020-11-29 | Discharge: 2020-11-29 | Disposition: A | Discharge status: Home / Self Care | Payer: Medicaid Other | Attending: Emergency Medicine | Admitting: Emergency Medicine

## 2020-11-29 ENCOUNTER — Encounter (HOSPITAL_COMMUNITY): Payer: Self-pay

## 2020-11-29 ENCOUNTER — Other Ambulatory Visit: Payer: Self-pay

## 2020-11-29 DIAGNOSIS — N390 Urinary tract infection, site not specified: Secondary | ICD-10-CM | POA: Diagnosis not present

## 2020-11-29 DIAGNOSIS — N939 Abnormal uterine and vaginal bleeding, unspecified: Secondary | ICD-10-CM | POA: Diagnosis not present

## 2020-11-29 DIAGNOSIS — Z8616 Personal history of COVID-19: Secondary | ICD-10-CM | POA: Insufficient documentation

## 2020-11-29 DIAGNOSIS — I10 Essential (primary) hypertension: Secondary | ICD-10-CM | POA: Insufficient documentation

## 2020-11-29 DIAGNOSIS — R059 Cough, unspecified: Secondary | ICD-10-CM | POA: Diagnosis not present

## 2020-11-29 DIAGNOSIS — R102 Pelvic and perineal pain: Secondary | ICD-10-CM | POA: Diagnosis present

## 2020-11-29 DIAGNOSIS — Z20822 Contact with and (suspected) exposure to covid-19: Secondary | ICD-10-CM | POA: Insufficient documentation

## 2020-11-29 DIAGNOSIS — N12 Tubulo-interstitial nephritis, not specified as acute or chronic: Secondary | ICD-10-CM | POA: Insufficient documentation

## 2020-11-29 DIAGNOSIS — Z87891 Personal history of nicotine dependence: Secondary | ICD-10-CM | POA: Insufficient documentation

## 2020-11-29 LAB — CBC WITH DIFFERENTIAL/PLATELET
Abs Immature Granulocytes: 0.03 10*3/uL (ref 0.00–0.07)
Basophils Absolute: 0 10*3/uL (ref 0.0–0.1)
Basophils Relative: 1 %
Eosinophils Absolute: 0 10*3/uL (ref 0.0–0.5)
Eosinophils Relative: 0 %
HCT: 42.2 % (ref 36.0–46.0)
Hemoglobin: 14.3 g/dL (ref 12.0–15.0)
Immature Granulocytes: 1 %
Lymphocytes Relative: 16 %
Lymphs Abs: 0.9 10*3/uL (ref 0.7–4.0)
MCH: 31.4 pg (ref 26.0–34.0)
MCHC: 33.9 g/dL (ref 30.0–36.0)
MCV: 92.5 fL (ref 80.0–100.0)
Monocytes Absolute: 0.5 10*3/uL (ref 0.1–1.0)
Monocytes Relative: 10 %
Neutro Abs: 3.9 10*3/uL (ref 1.7–7.7)
Neutrophils Relative %: 72 %
Platelets: 187 10*3/uL (ref 150–400)
RBC: 4.56 MIL/uL (ref 3.87–5.11)
RDW: 12.2 % (ref 11.5–15.5)
WBC: 5.4 10*3/uL (ref 4.0–10.5)
nRBC: 0 % (ref 0.0–0.2)

## 2020-11-29 LAB — COMPREHENSIVE METABOLIC PANEL
ALT: 19 U/L (ref 0–44)
AST: 22 U/L (ref 15–41)
Albumin: 4.5 g/dL (ref 3.5–5.0)
Alkaline Phosphatase: 68 U/L (ref 38–126)
Anion gap: 10 (ref 5–15)
BUN: 12 mg/dL (ref 6–20)
CO2: 21 mmol/L — ABNORMAL LOW (ref 22–32)
Calcium: 9 mg/dL (ref 8.9–10.3)
Chloride: 106 mmol/L (ref 98–111)
Creatinine, Ser: 0.94 mg/dL (ref 0.44–1.00)
GFR, Estimated: >60 mL/min (ref >60)
Glucose, Bld: 107 mg/dL — ABNORMAL HIGH (ref 70–99)
Potassium: 3.6 mmol/L (ref 3.5–5.1)
Sodium: 137 mmol/L (ref 135–145)
Total Bilirubin: 1.1 mg/dL (ref 0.3–1.2)
Total Protein: 8.2 g/dL — ABNORMAL HIGH (ref 6.5–8.1)

## 2020-11-29 LAB — URINALYSIS, ROUTINE W REFLEX MICROSCOPIC
Bilirubin Urine: NEGATIVE
Glucose, UA: NEGATIVE mg/dL
Ketones, ur: NEGATIVE mg/dL
Nitrite: NEGATIVE
Protein, ur: 30 mg/dL — AB
Specific Gravity, Urine: 1.014 (ref 1.005–1.030)
WBC, UA: >50 WBC/hpf — ABNORMAL HIGH (ref 0–5)
pH: 6 (ref 5.0–8.0)

## 2020-11-29 LAB — RESP PANEL BY RT-PCR (FLU A&B, COVID) ARPGX2
Influenza A by PCR: NEGATIVE
Influenza B by PCR: NEGATIVE
SARS Coronavirus 2 by RT PCR: NEGATIVE

## 2020-11-29 LAB — I-STAT BETA HCG BLOOD, ED (MC, WL, AP ONLY): I-stat hCG, quantitative: <5 m[IU]/mL (ref <5)

## 2020-11-29 MED ORDER — SODIUM CHLORIDE 0.9 % IV SOLN
12.5000 mg | Freq: Once | INTRAVENOUS | Status: AC
  Administered 2020-11-29: 12.5 mg via INTRAVENOUS
  Filled 2020-11-29: qty 12.5

## 2020-11-29 MED ORDER — CEPHALEXIN 500 MG PO CAPS: 500.0000 mg | ORAL_CAPSULE | Freq: Four times a day (QID) | ORAL | 0 refills | Status: AC

## 2020-11-29 MED ORDER — IOHEXOL 300 MG/ML  SOLN
100.0000 mL | Freq: Once | INTRAMUSCULAR | Status: AC | PRN
  Administered 2020-11-29: 100 mL via INTRAVENOUS

## 2020-11-29 MED ORDER — CEPHALEXIN 500 MG PO CAPS: 500.0000 mg | ORAL_CAPSULE | Freq: Four times a day (QID) | ORAL | 0 refills | Status: DC

## 2020-11-29 MED ORDER — FENTANYL CITRATE (PF) 100 MCG/2ML IJ SOLN
50.0000 ug | Freq: Once | INTRAMUSCULAR | Status: AC
  Administered 2020-11-29: 50 ug via INTRAVENOUS
  Filled 2020-11-29: qty 2

## 2020-11-29 MED ORDER — ONDANSETRON 4 MG PO TBDP: 4.0000 mg | ORAL_TABLET | Freq: Three times a day (TID) | ORAL | 0 refills | Status: DC | PRN

## 2020-11-29 MED ORDER — ONDANSETRON HCL 4 MG/2ML IJ SOLN
4.0000 mg | Freq: Once | INTRAMUSCULAR | Status: AC
  Administered 2020-11-29: 4 mg via INTRAVENOUS
  Filled 2020-11-29: qty 2

## 2020-11-29 MED ORDER — SODIUM CHLORIDE (PF) 0.9 % IJ SOLN
INTRAMUSCULAR | Status: AC
  Filled 2020-11-29: qty 50

## 2020-11-29 MED ORDER — IBUPROFEN 800 MG PO TABS
800.0000 mg | ORAL_TABLET | Freq: Once | ORAL | Status: AC
  Administered 2020-11-29: 800 mg via ORAL
  Filled 2020-11-29: qty 1

## 2020-11-29 MED ORDER — SODIUM CHLORIDE 0.9 % IV SOLN
1.0000 g | Freq: Once | INTRAVENOUS | Status: AC
  Administered 2020-11-29: 1 g via INTRAVENOUS
  Filled 2020-11-29: qty 10

## 2020-11-29 MED ORDER — SODIUM CHLORIDE 0.9 % IV BOLUS
1000.0000 mL | Freq: Once | INTRAVENOUS | Status: AC
  Administered 2020-11-29: 1000 mL via INTRAVENOUS

## 2020-11-29 NOTE — ED Triage Notes (Signed)
Pt arrived via walk in, c/o n/v right sided flank pain and chills x3. Denies anydiarrhea or urinary problems. States she had tampon in from 11am yesterday to-1am this morning and believes this is making sx worse, she is concerned for toxic shock.

## 2020-11-29 NOTE — ED Provider Notes (Signed)
[facility]-EMERGENCY DEPT Provider Note   CSN: [id number] Arrival date & time: 11/29/20  0815     History No chief complaint on file.   Stacie Harding is a 25 y.o. female.  Presents to ER with concern for nausea vomiting, right-sided abdominal and flank pain.  Symptoms ongoing for the last 3 days or so.  Has not had any associated blood in urine or pain with urination.  Vomit is nonbloody nonbilious.  Pain is up to 8 out of 10 in severity sharp and stabbing.  Constant.  States that she has Nexplanon, has had issues with irregular bleeding, has had some vaginal bleeding over the past month.  No change in vaginal bleeding today.  States that she forgot a tampon, tampon was in from 11 AM yesterday until 1 AM this morning (14 hours).  Some chills but no documented fevers.  States her kids at home have had recent colds.  She has had mild cough has been nonproductive.  HPI     Past Medical History:  Diagnosis Date   Anemia    Anxiety    COVID-19 02/17/2020   Depression    Hypertension     Patient Active Problem List   Diagnosis Date Noted   Nexplanon insertion 06/27/2020   Indication for care in labor or delivery 06/26/2020   Severe episode of recurrent major depressive disorder, without psychotic features (HCC) 06/08/2020   Current moderate episode of major depressive disorder without prior episode (HCC)    COVID-19 virus infection 02/18/2020   Genetic carrier 01/05/2020   Abnormal genetic test during pregnancy 12/16/2019   Chronic hypertension affecting pregnancy 12/08/2019   Supervision of other normal pregnancy, antepartum 12/08/2019   LGSIL on Pap smear of cervix 09/13/2017   Vaginal delivery 08/05/2017   Left knee injury 02/21/2012    Past Surgical History:  Procedure Laterality Date   KNEE SURGERY       OB History     Gravida  2   Para  2   Term  2   Preterm  0   AB  0   Living  2      SAB  0   IAB  0   Ectopic  0   Multiple   0   Live Births  2           Family History  Problem Relation Age of Onset   Diabetes Maternal Grandmother    Hypertension Maternal Grandmother    Sudden death Neg Hx    Hyperlipidemia Neg Hx    Heart attack Neg Hx     Social History   Tobacco Use   Smoking status: Former    Pack years: 0.00    Types: Cigarettes    Quit date: 11/2016    Years since quitting: 4.0   Smokeless tobacco: Never  Vaping Use   Vaping Use: Never used  Substance Use Topics   Alcohol use: Not Currently   Drug use: No    Home Medications Prior to Admission medications   Medication Sig Start Date End Date Taking? Authorizing Provider  cephALEXin (KEFLEX) 500 MG capsule Take 1 capsule (500 mg total) by mouth 4 (four) times daily for 7 days. 11/29/20 12/06/20 Yes Milagros Lollykstra, Estelita Iten S, MD  ondansetron (ZOFRAN ODT) 4 MG disintegrating tablet Take 1 tablet (4 mg total) by mouth every 8 (eight) hours as needed for nausea or vomiting. 11/29/20  Yes Milagros Lollykstra, Shamaya Kauer S, MD  ibuprofen (ADVIL) 600 MG tablet  Take 1 tablet (600 mg total) by mouth every 6 (six) hours. 06/28/20   Cresenzo-Dishmon, Scarlette Calico, CNM  Prenatal 27-1 MG TABS Take 1 tablet by mouth daily. 12/09/19   [provider]    Allergies    Iodine  Review of Systems   Review of Systems  Constitutional:  Negative for chills and fever.  HENT:  Negative for ear pain and sore throat.   Eyes:  Negative for pain and visual disturbance.  Respiratory:  Positive for cough. Negative for shortness of breath.   Cardiovascular:  Negative for chest pain and palpitations.  Gastrointestinal:  Positive for abdominal pain, nausea and vomiting.  Genitourinary:  Positive for flank pain. Negative for dysuria and hematuria.  Musculoskeletal:  Negative for arthralgias and back pain.  Skin:  Negative for color change and rash.  Neurological:  Negative for seizures and syncope.  All other systems reviewed and are negative.  Physical Exam Updated Vital  Signs BP 118/68   Pulse 91   Temp 99.8 F (37.7 C) (Oral)   Resp 17   LMP 11/29/2020   SpO2 100%   Physical Exam Vitals and nursing note reviewed.  Constitutional:      General: She is not in acute distress.    Appearance: She is well-developed.  HENT:     Head: Normocephalic and atraumatic.  Eyes:     Conjunctiva/sclera: Conjunctivae normal.  Cardiovascular:     Rate and Rhythm: Normal rate and regular rhythm.     Heart sounds: No murmur heard. Pulmonary:     Effort: Pulmonary effort is normal. No respiratory distress.     Breath sounds: Normal breath sounds.  Abdominal:     Palpations: Abdomen is soft.     Tenderness: There is no abdominal tenderness.     Comments: some tenderness in right mid abdomen, right lower quadrant and right flank, some CVA tenderness present  Musculoskeletal:     Cervical back: Neck supple.  Skin:    General: Skin is warm and dry.  Neurological:     Mental Status: She is alert.    ED Results / Procedures / Treatments   Labs (all labs ordered are listed, but only abnormal results are displayed) Labs Reviewed  COMPREHENSIVE METABOLIC PANEL - Abnormal; Notable for the following components:      Result Value   CO2 21 (*)    Glucose, Bld 107 (*)    Total Protein 8.2 (*)    All other components within normal limits  URINALYSIS, ROUTINE W REFLEX MICROSCOPIC - Abnormal; Notable for the following components:   APPearance HAZY (*)    Hgb urine dipstick MODERATE (*)    Protein, ur 30 (*)    Leukocytes,Ua MODERATE (*)    WBC, UA >50 (*)    Bacteria, UA MANY (*)    All other components within normal limits  RESP PANEL BY RT-PCR (FLU A&B, COVID) ARPGX2  CBC WITH DIFFERENTIAL/PLATELET  I-STAT BETA HCG BLOOD, ED ([facility], [facility], [facility] ONLY)    EKG None  Radiology CT ABDOMEN PELVIS W CONTRAST  Result Date: 11/29/2020 CLINICAL DATA:  Right lower quadrant and right flank pain, appendicitis, nephrolithiasis, pyelonephritis suspected EXAM: CT ABDOMEN AND  PELVIS WITH CONTRAST TECHNIQUE: Multidetector CT imaging of the abdomen and pelvis was performed using the standard protocol following bolus administration of intravenous contrast. CONTRAST:  OMNIPAQUE IOHEXOL 300 MG/ML  SOLN COMPARISON:  06/22/2019 FINDINGS: Lower chest: No acute abnormality. Hepatobiliary: No solid liver abnormality is seen. No gallstones, gallbladder  wall thickening, or biliary dilatation. Pancreas: Unremarkable. No pancreatic ductal dilatation or surrounding inflammatory changes. Spleen: Normal in size without significant abnormality. Adrenals/Urinary Tract: Adrenal glands are unremarkable. There is prominence of the right renal pelvis and mild high hydroureter to the right ureterovesicular junction with hyperenhancement of the mucosa. There is no obstructing calculus or other lesion identified. Bladder is unremarkable. Stomach/Bowel: Stomach is within normal limits. Normal retrocecal appendix. No evidence of bowel wall thickening, distention, or inflammatory changes. Vascular/Lymphatic: There are prominent, left greater than right ovarian and adnexal varices, with enlargement of the left ovarian vein up to 1.1 cm near the confluence with the left renal artery. No enlarged abdominal or pelvic lymph nodes. Reproductive: No mass or other significant abnormality. 2.8 cm cyst of the right ovary, new compared to prior examination (series 2, image 56). Other: No abdominal wall hernia or abnormality. No abdominopelvic ascites. Musculoskeletal: No acute or significant osseous findings. IMPRESSION: 1. There is prominence of the right renal pelvis and mild high hydroureter to the right ureterovesicular junction with hyperenhancement of the mucosa. There is no obstructing calculus or other lesion identified. This appearance could be related to urinary tract infection or alternately recently passed calculus. Correlate with urinalysis. 2. Normal appendix. 3. There is a 2.8 cm cyst of the right ovary,  new compared to prior examination although benign and functional in the reproductive age setting. This could be symptomatic if hemorrhagic. Ultrasound may be used to further evaluate if clinical signs and symptoms are felt referable. Electronically Signed   By: Lauralyn Primes M.D.   On: 11/29/2020 13:06   DG Chest Portable 1 View  Result Date: 11/29/2020 CLINICAL DATA:  Cough and chills rule out pneumonia EXAM: PORTABLE CHEST 1 VIEW COMPARISON:  01/08/2019 FINDINGS: The heart size and mediastinal contours are within normal limits. Both lungs are clear. The visualized skeletal structures are unremarkable. IMPRESSION: No active disease. Electronically Signed   By: Marlan Palau M.D.   On: 11/29/2020 10:14    Procedures Procedures   Medications Ordered in ED Medications  sodium chloride 0.9 % bolus 1,000 mL (0 mLs Intravenous Stopped 11/29/20 1205)  ondansetron (ZOFRAN) injection 4 mg (4 mg Intravenous Given 11/29/20 1008)  fentaNYL (SUBLIMAZE) injection 50 mcg (50 mcg Intravenous Given 11/29/20 1008)  cefTRIAXone (ROCEPHIN) 1 g in sodium chloride 0.9 % 100 mL IVPB (0 g Intravenous Stopped 11/29/20 1451)  iohexol (OMNIPAQUE) 300 MG/ML solution 100 mL (100 mLs Intravenous Contrast Given 11/29/20 1137)  sodium chloride (PF) 0.9 % injection (  Given by Other 11/29/20 1539)  promethazine (PHENERGAN) 12.5 mg in sodium chloride 0.9 % 50 mL IVPB (0 mg Intravenous Stopped 11/29/20 1539)  fentaNYL (SUBLIMAZE) injection 50 mcg (50 mcg Intravenous Given 11/29/20 1516)  ibuprofen (ADVIL) tablet 800 mg (800 mg Oral Given 11/29/20 1516)    ED Course  I have reviewed the triage vital signs and the nursing notes.  Pertinent labs & imaging results that were available during my care of the patient were reviewed by me and considered in my medical decision making (see chart for details).  Clinical Course as of 11/29/20 1642  Tue Nov 29, 2020  1048 Urinalysis, Routine w reflex microscopic Urine, Clean Catch(!) Given  symptomatology, concern for possible UTI/pyelo - will give dose of rocephin, await CT to eval further [RD]    Clinical Course User Index [RD] Milagros Loll, MD   MDM Rules/Calculators/A&P  25 year old lady presenting to ER with concern for flank pain, vomiting and chills.  Work-up concerning for UTI.  CT scan with slight prominence of the renal pelvis on right side.  Suspect patient has pyelonephritis..  Provided fluids, antiemetics, ceftriaxone.  On reassessment, patient had fever and some additional nausea.  Further observation in ER, provided antiemetic and antipyretic.  Patient subsequently was able to tolerate p.o. without difficulty and had no further episodes of vomiting.  She appears well.  At present believe she is appropriate for outpatient management.  Reviewed return precautions in detail with patient and ultimately discharged home.  After the discussed management above, the patient was determined to be safe for discharge.  The patient was in agreement with this plan and all questions regarding their care were answered.  ED return precautions were discussed and the patient will return to the ED with any significant worsening of condition.  Final Clinical Impression(s) / ED Diagnoses Final diagnoses:  Urinary tract infection without hematuria, site unspecified  Pyelonephritis    Rx / DC Orders ED Discharge Orders          Ordered    cephALEXin (KEFLEX) 500 MG capsule  4 times daily        11/29/20 1417    ondansetron (ZOFRAN ODT) 4 MG disintegrating tablet  Every 8 hours PRN        11/29/20 1417             Milagros Loll, MD 11/29/20 1642

## 2020-11-29 NOTE — Discharge Instructions (Addendum)
Please follow-up with primary care doctor for recheck later this week.  Take antibiotic as prescribed for your suspected urinary tract and kidney infection.  Take Zofran as needed for nausea.  If you develop fever, worsening vomiting, or other new concerning symptom, come back to ER for reassessment.

## 2021-02-20 ENCOUNTER — Encounter (HOSPITAL_BASED_OUTPATIENT_CLINIC_OR_DEPARTMENT_OTHER): Payer: Self-pay | Admitting: *Deleted

## 2021-02-20 ENCOUNTER — Other Ambulatory Visit: Payer: Self-pay

## 2021-02-20 ENCOUNTER — Emergency Department (HOSPITAL_BASED_OUTPATIENT_CLINIC_OR_DEPARTMENT_OTHER)
Admission: EM | Admit: 2021-02-20 | Discharge: 2021-02-20 | Disposition: A | Discharge status: Home / Self Care | Payer: Medicaid Other | Attending: Emergency Medicine | Admitting: Emergency Medicine

## 2021-02-20 DIAGNOSIS — Z5321 Procedure and treatment not carried out due to patient leaving prior to being seen by health care provider: Secondary | ICD-10-CM | POA: Diagnosis not present

## 2021-02-20 DIAGNOSIS — H9202 Otalgia, left ear: Secondary | ICD-10-CM | POA: Diagnosis not present

## 2021-02-20 DIAGNOSIS — M79674 Pain in right toe(s): Secondary | ICD-10-CM | POA: Insufficient documentation

## 2021-02-20 NOTE — ED Triage Notes (Signed)
Right great toe pain since dropping her suitcase on it yesterday.  Also reports left ear pain since swimming in the ocean 2 days ago.

## 2021-12-07 ENCOUNTER — Ambulatory Visit: Payer: Medicaid Other | Admitting: Nurse Practitioner

## 2022-03-19 ENCOUNTER — Emergency Department (HOSPITAL_BASED_OUTPATIENT_CLINIC_OR_DEPARTMENT_OTHER)
Admission: EM | Admit: 2022-03-19 | Discharge: 2022-03-19 | Disposition: A | Discharge status: Home / Self Care | Payer: Medicaid Other | Attending: Emergency Medicine | Admitting: Emergency Medicine

## 2022-03-19 ENCOUNTER — Encounter (HOSPITAL_BASED_OUTPATIENT_CLINIC_OR_DEPARTMENT_OTHER): Payer: Self-pay | Admitting: Emergency Medicine

## 2022-03-19 ENCOUNTER — Other Ambulatory Visit: Payer: Self-pay

## 2022-03-19 DIAGNOSIS — D649 Anemia, unspecified: Secondary | ICD-10-CM | POA: Insufficient documentation

## 2022-03-19 DIAGNOSIS — R1031 Right lower quadrant pain: Secondary | ICD-10-CM | POA: Insufficient documentation

## 2022-03-19 DIAGNOSIS — E876 Hypokalemia: Secondary | ICD-10-CM | POA: Diagnosis not present

## 2022-03-19 DIAGNOSIS — I10 Essential (primary) hypertension: Secondary | ICD-10-CM | POA: Insufficient documentation

## 2022-03-19 DIAGNOSIS — R110 Nausea: Secondary | ICD-10-CM | POA: Insufficient documentation

## 2022-03-19 LAB — CBC
HCT: 33.8 % — ABNORMAL LOW (ref 36.0–46.0)
Hemoglobin: 11.4 g/dL — ABNORMAL LOW (ref 12.0–15.0)
MCH: 31.1 pg (ref 26.0–34.0)
MCHC: 33.7 g/dL (ref 30.0–36.0)
MCV: 92.1 fL (ref 80.0–100.0)
Platelets: 201 10*3/uL (ref 150–400)
RBC: 3.67 MIL/uL — ABNORMAL LOW (ref 3.87–5.11)
RDW: 12.3 % (ref 11.5–15.5)
WBC: 4.8 10*3/uL (ref 4.0–10.5)
nRBC: 0 % (ref 0.0–0.2)

## 2022-03-19 LAB — URINALYSIS, ROUTINE W REFLEX MICROSCOPIC
Bilirubin Urine: NEGATIVE
Glucose, UA: NEGATIVE mg/dL
Hgb urine dipstick: NEGATIVE
Ketones, ur: NEGATIVE mg/dL
Leukocytes,Ua: NEGATIVE
Nitrite: NEGATIVE
Protein, ur: NEGATIVE mg/dL
Specific Gravity, Urine: 1.01 (ref 1.005–1.030)
pH: 7 (ref 5.0–8.0)

## 2022-03-19 LAB — COMPREHENSIVE METABOLIC PANEL
ALT: 16 U/L (ref 0–44)
AST: 16 U/L (ref 15–41)
Albumin: 3.2 g/dL — ABNORMAL LOW (ref 3.5–5.0)
Alkaline Phosphatase: 53 U/L (ref 38–126)
Anion gap: 3 — ABNORMAL LOW (ref 5–15)
BUN: 13 mg/dL (ref 6–20)
CO2: 24 mmol/L (ref 22–32)
Calcium: 8.2 mg/dL — ABNORMAL LOW (ref 8.9–10.3)
Chloride: 112 mmol/L — ABNORMAL HIGH (ref 98–111)
Creatinine, Ser: 0.58 mg/dL (ref 0.44–1.00)
GFR, Estimated: >60 mL/min (ref >60)
Glucose, Bld: 97 mg/dL (ref 70–99)
Potassium: 3.3 mmol/L — ABNORMAL LOW (ref 3.5–5.1)
Sodium: 139 mmol/L (ref 135–145)
Total Bilirubin: 0.3 mg/dL (ref 0.3–1.2)
Total Protein: 6 g/dL — ABNORMAL LOW (ref 6.5–8.1)

## 2022-03-19 LAB — LIPASE, BLOOD: Lipase: 27 U/L (ref 11–51)

## 2022-03-19 LAB — PREGNANCY, URINE: Preg Test, Ur: NEGATIVE

## 2022-03-19 MED ORDER — POTASSIUM CHLORIDE CRYS ER 20 MEQ PO TBCR
40.0000 meq | EXTENDED_RELEASE_TABLET | Freq: Once | ORAL | Status: AC
  Administered 2022-03-19: 40 meq via ORAL
  Filled 2022-03-19: qty 2

## 2022-03-19 MED ORDER — SODIUM CHLORIDE 0.9 % IV BOLUS
1000.0000 mL | Freq: Once | INTRAVENOUS | Status: AC
  Administered 2022-03-19: 1000 mL via INTRAVENOUS

## 2022-03-19 MED ORDER — KETOROLAC TROMETHAMINE 15 MG/ML IJ SOLN
30.0000 mg | Freq: Once | INTRAMUSCULAR | Status: AC
  Administered 2022-03-19: 30 mg via INTRAVENOUS
  Filled 2022-03-19: qty 2

## 2022-03-19 MED ORDER — MORPHINE SULFATE (PF) 4 MG/ML IV SOLN
4.0000 mg | Freq: Once | INTRAVENOUS | Status: AC
  Administered 2022-03-19: 4 mg via INTRAVENOUS
  Filled 2022-03-19: qty 1

## 2022-03-19 MED ORDER — ONDANSETRON HCL 4 MG/2ML IJ SOLN
4.0000 mg | Freq: Once | INTRAMUSCULAR | Status: AC
  Administered 2022-03-19: 4 mg via INTRAVENOUS
  Filled 2022-03-19: qty 2

## 2022-03-19 NOTE — ED Provider Notes (Signed)
[facility] EMERGENCY DEPARTMENT Provider Note   CSN: [id number] Arrival date & time: 03/19/22  1958     History  Chief Complaint  Patient presents with   Abdominal Pain    Stacie Harding is a 26 y.o. female.   Abdominal Pain Patient is a 26 year old female with no history of abdominal surgeries, history of hypertension, anemia, depression, anxiety   She is presented to the emergency room today with complaints of 2 weeks of relatively constant right lower quadrant abdominal pain.  She states that it is sharp similar to cyst pain that she has had in the past.  She denies any vaginal bleeding or discharge  She endorses nausea without vomiting.   No urinary frequency, urgency or dysuria or blood in urine.   No CP or SOB. No diarrhea.      Home Medications Prior to Admission medications   Medication Sig Start Date End Date Taking? Authorizing Provider  ibuprofen (ADVIL) 600 MG tablet Take 1 tablet (600 mg total) by mouth every 6 (six) hours. 06/28/20   Cresenzo-Dishmon, Scarlette Calico, CNM  ondansetron (ZOFRAN ODT) 4 MG disintegrating tablet Take 1 tablet (4 mg total) by mouth every 8 (eight) hours as needed for nausea or vomiting. 11/29/20   Milagros Loll, MD  Prenatal 27-1 MG TABS Take 1 tablet by mouth daily. 12/09/19   [provider]      Allergies    Iodine    Review of Systems   Review of Systems  Gastrointestinal:  Positive for abdominal pain.    Physical Exam Updated Vital Signs BP 118/79 (BP Location: Left Arm)   Pulse 82   Temp 98.5 F (36.9 C) (Oral)   Resp 20   Ht 5\' 3"  (1.6 m)   Wt 61.7 kg   LMP 03/05/2022   SpO2 100%   BMI 24.09 kg/m  Physical Exam Vitals and nursing note reviewed.  Constitutional:      General: She is not in acute distress. HENT:     Head: Normocephalic and atraumatic.     Nose: Nose normal.  Eyes:     General: No scleral icterus. Cardiovascular:     Rate and Rhythm: Normal rate and regular rhythm.      Pulses: Normal pulses.     Heart sounds: Normal heart sounds.  Pulmonary:     Effort: Pulmonary effort is normal. No respiratory distress.     Breath sounds: No wheezing.  Abdominal:     Palpations: Abdomen is soft.     Tenderness: There is abdominal tenderness in the right lower quadrant. There is no right CVA tenderness, left CVA tenderness, guarding or rebound. Negative signs include Murphy's sign, Rovsing's sign and psoas sign.  Musculoskeletal:     Cervical back: Normal range of motion.     Right lower leg: No edema.     Left lower leg: No edema.  Skin:    General: Skin is warm and dry.     Capillary Refill: Capillary refill takes less than 2 seconds.  Neurological:     Mental Status: She is alert. Mental status is at baseline.  Psychiatric:        Mood and Affect: Mood normal.        Behavior: Behavior normal.     ED Results / Procedures / Treatments   Labs (all labs ordered are listed, but only abnormal results are displayed) Labs Reviewed  COMPREHENSIVE METABOLIC PANEL - Abnormal; Notable for the following components:  Result Value   Potassium 3.3 (*)    Chloride 112 (*)    Calcium 8.2 (*)    Total Protein 6.0 (*)    Albumin 3.2 (*)    Anion gap 3 (*)    All other components within normal limits  CBC - Abnormal; Notable for the following components:   RBC 3.67 (*)    Hemoglobin 11.4 (*)    HCT 33.8 (*)    All other components within normal limits  URINALYSIS, ROUTINE W REFLEX MICROSCOPIC  PREGNANCY, URINE  LIPASE, BLOOD    EKG None  Radiology No results found.  Procedures Procedures    Medications Ordered in ED Medications  morphine (PF) 4 MG/ML injection 4 mg (4 mg Intravenous Given 03/19/22 2216)  ondansetron (ZOFRAN) injection 4 mg (4 mg Intravenous Given 03/19/22 2215)  sodium chloride 0.9 % bolus 1,000 mL (0 mLs Intravenous Stopped 03/19/22 2259)  potassium chloride SA (KLOR-CON M) CR tablet 40 mEq (40 mEq Oral Given 03/19/22 2331)   ketorolac (TORADOL) 15 MG/ML injection 30 mg (30 mg Intravenous Given 03/19/22 2330)    ED Course/ Medical Decision Making/ A&P                           Medical Decision Making Amount and/or Complexity of Data Reviewed Labs: ordered.  Risk Prescription drug management.   Patient is a 26 year old female with no history of abdominal surgeries, history of hypertension, anemia, depression, anxiety    She is presented to the emergency room today with complaints of 2 weeks of relatively constant right lower quadrant abdominal pain.  She states that it is sharp similar to cyst pain that she has had in the past.  She denies any vaginal bleeding or discharge  She endorses nausea without vomiting.   No urinary frequency, urgency or dysuria or blood in urine.   No CP or SOB. No diarrhea.    Physical exam initially with some right lower quadrant/right pelvic tenderness.  Provided 1 dose of morphine, Zofran for nausea, potassium for hypokalemia  On reassessment patient states no pain.  She is not particularly tender with palpation.  Certainly no guarding or rebound.  Given her symptoms and the fact that has been persistent for 2 weeks starting right after her last menstrual cycle.  I have a somewhat high suspicion for this being a cyst.  CBC with anemia of 11.4 seems to be approximately at her baseline although she has had a somewhat variable baseline.  CMP with mild hypokalemia 3.3.  Otherwise normal.  No leukocytosis present.  Lipase within normal limits urinalysis unremarkable and urine pregnancy negative.  Given 1 dose of Toradol given that I suspect this is cyst related pain.  She will follow-up with her OB/GYN.  Strict return precautions to return to the emergency room were provided.   Final Clinical Impression(s) / ED Diagnoses Final diagnoses:  Right lower quadrant abdominal pain    Rx / DC Orders ED Discharge Orders     None         Tedd Sias, [place] 03/19/22  2337    Dorie Rank, MD 03/21/22 [phone]

## 2022-03-19 NOTE — ED Triage Notes (Signed)
Pt arrives pov, c/o RLQ pain x 2 weeks, with nausea today. Hx of ovarian cysts. Ibuprofen at 1600

## 2022-03-19 NOTE — Discharge Instructions (Addendum)
I suspect your pain is related to another cyst.  Your workup today was quite reassuring.   Please use Tylenol or ibuprofen for pain.  You may use 600 mg ibuprofen every 6 hours or 1000 mg of Tylenol every 6 hours.  You may choose to alternate between the 2.  This would be most effective.  Not to exceed 4 g of Tylenol within 24 hours.  Not to exceed 3200 mg ibuprofen 24 hours.   Follow up with your OBGYN.

## 2022-03-23 ENCOUNTER — Encounter: Payer: Self-pay | Admitting: Family Medicine

## 2022-03-23 ENCOUNTER — Ambulatory Visit: Payer: Medicaid Other | Admitting: Family Medicine

## 2022-03-23 ENCOUNTER — Ambulatory Visit (HOSPITAL_BASED_OUTPATIENT_CLINIC_OR_DEPARTMENT_OTHER)
Admission: RE | Admit: 2022-03-23 | Discharge: 2022-03-23 | Disposition: A | Discharge status: Home / Self Care | Payer: Medicaid Other | Source: Ambulatory Visit | Attending: Family Medicine | Admitting: Family Medicine

## 2022-03-23 ENCOUNTER — Other Ambulatory Visit: Payer: Self-pay | Admitting: Family Medicine

## 2022-03-23 VITALS — BP 119/74 | HR 68 | Wt 133.0 lb

## 2022-03-23 DIAGNOSIS — N939 Abnormal uterine and vaginal bleeding, unspecified: Secondary | ICD-10-CM | POA: Diagnosis not present

## 2022-03-23 DIAGNOSIS — R102 Pelvic and perineal pain: Secondary | ICD-10-CM

## 2022-03-23 MED ORDER — NORETHIN ACE-ETH ESTRAD-FE 1-20 MG-MCG(24) PO TABS: 1.0000 | ORAL_TABLET | Freq: Every day | ORAL | 11 refills | Status: DC

## 2022-03-23 NOTE — Progress Notes (Signed)
   Subjective:    Patient ID: Stacie Harding, female    DOB: [birth year], 26 y.o.   MRN: [id number]  HPI Patient seen for right lower quadrant pain.  She was seen in the emergency department 4 days ago.  Since then she has had continued and slightly increased pain.  She is rates the pain 8 out of 10.  She did get a dose of Toradol in the ER which was helpful for her pain.  No radiation of the pain.  Additionally, having prolonged bleeding on nexplanon - bleeds for about 2 months straight, then no bleeding for 2 months. This has been this pattern for a few months now.  Review of Systems     Objective:   Physical Exam Vitals reviewed.  Constitutional:      Appearance: Normal appearance.  Cardiovascular:     Rate and Rhythm: Normal rate and regular rhythm.  Abdominal:     General: Abdomen is flat. There is no distension.     Palpations: Abdomen is soft. There is no mass.     Tenderness: There is abdominal tenderness (RLQ). There is no guarding or rebound.  Skin:    General: Skin is warm and dry.     Capillary Refill: Capillary refill takes less than 2 seconds.  Neurological:     General: No focal deficit present.     Mental Status: She is alert.  Psychiatric:        Mood and Affect: Mood normal.        Behavior: Behavior normal.        Thought Content: Thought content normal.        Judgment: Judgment normal.       Assessment & Plan:  1. Pelvic pain in female Feels similar to prior ovarian cyst.  Will check [place]. - US PELVIS TRANSVAGINAL NON-OB (TV ONLY); Future  2. Abnormal uterine bleeding (AUB) Due to nexplanon.  Start COCs. Will have patient return for nexplanon removal and change to something else.

## 2022-03-28 ENCOUNTER — Telehealth: Payer: Self-pay

## 2022-03-28 NOTE — Telephone Encounter (Signed)
Patient states that her bleeding has increased. Patient has not picked up the birth control that was prescribed for her on OCt 20th.  Instructed patient that Dr. Nehemiah Settle prescribed this to help her bleeding and she should pick it up and start taking it today to help slow her bleeding. Patient states understanding. Kathrene Alu RN

## 2022-03-28 NOTE — Telephone Encounter (Signed)
-----   Message from Maurine Minister, [place] sent at 03/28/2022  9:45 AM EDT ----- Regarding: Request Call Back Left message on VM wanting to follow up about [place]. Still having pain with bleeding.  She's scheduled on 05/10/2022 with Dr. Nehemiah Settle for Nexplanon removal.

## 2022-04-16 ENCOUNTER — Ambulatory Visit: Payer: Medicaid Other | Admitting: Obstetrics and Gynecology

## 2022-05-10 ENCOUNTER — Ambulatory Visit: Payer: Medicaid Other | Admitting: Family Medicine

## 2022-08-30 ENCOUNTER — Ambulatory Visit: Payer: Medicaid Other | Admitting: Family Medicine

## 2022-08-30 ENCOUNTER — Encounter: Payer: Self-pay | Admitting: Family Medicine

## 2022-08-30 NOTE — Progress Notes (Signed)
Patient did not keep appointment today. She may call to reschedule.  

## 2022-09-20 ENCOUNTER — Encounter (HOSPITAL_BASED_OUTPATIENT_CLINIC_OR_DEPARTMENT_OTHER): Payer: Self-pay | Admitting: Urology

## 2022-09-20 ENCOUNTER — Other Ambulatory Visit: Payer: Self-pay

## 2022-09-20 ENCOUNTER — Emergency Department (HOSPITAL_BASED_OUTPATIENT_CLINIC_OR_DEPARTMENT_OTHER)
Admission: EM | Admit: 2022-09-20 | Discharge: 2022-09-20 | Disposition: A | Discharge status: Home / Self Care | Payer: Medicaid Other | Attending: Emergency Medicine | Admitting: Emergency Medicine

## 2022-09-20 DIAGNOSIS — S161XXA Strain of muscle, fascia and tendon at neck level, initial encounter: Secondary | ICD-10-CM | POA: Insufficient documentation

## 2022-09-20 DIAGNOSIS — S199XXA Unspecified injury of neck, initial encounter: Secondary | ICD-10-CM | POA: Diagnosis present

## 2022-09-20 DIAGNOSIS — X501XXA Overexertion from prolonged static or awkward postures, initial encounter: Secondary | ICD-10-CM | POA: Insufficient documentation

## 2022-09-20 MED ORDER — CYCLOBENZAPRINE HCL 5 MG PO TABS: 5.0000 mg | ORAL_TABLET | Freq: Three times a day (TID) | ORAL | 0 refills | Status: DC | PRN

## 2022-09-20 NOTE — ED Provider Notes (Signed)
[facility] Provider Note   CSN: [id number] Arrival date & time: 09/20/22  1558     History  Chief Complaint  Patient presents with   Neck Pain    Stacie Harding is a 27 y.o. female, no pertinent past medical history, who presents to the ED secondary to right anterior neck pain has been going on for the last week.  She states that it is tight, and painful when she turns her head to the right.  Or looks up.  She denies any trauma, states that it just feels tight.  Has been using Tiger balm with some relief.  Denies any posterior neck pain.  Denies any sore throat, cough, ear pain.     Home Medications Prior to Admission medications   Medication Sig Start Date End Date Taking? Authorizing Provider  cyclobenzaprine (FLEXERIL) 5 MG tablet Take 1 tablet (5 mg total) by mouth 3 (three) times daily as needed for muscle spasms. 09/20/22  Yes Hiroyuki Ozanich L, PA  ibuprofen (ADVIL) 600 MG tablet Take 1 tablet (600 mg total) by mouth every 6 (six) hours. 06/28/20   Cresenzo-Dishmon, Scarlette Calico, CNM  Norethindrone Acetate-Ethinyl Estrad-FE (LOESTRIN 24 FE) 1-20 MG-MCG(24) tablet Take 1 tablet by mouth daily. 03/23/22   Levie Heritage, DO  ondansetron (ZOFRAN ODT) 4 MG disintegrating tablet Take 1 tablet (4 mg total) by mouth every 8 (eight) hours as needed for nausea or vomiting. 11/29/20   Milagros Loll, MD  Prenatal 27-1 MG TABS Take 1 tablet by mouth daily. 12/09/19   [provider]      Allergies    Iodine    Review of Systems   Review of Systems  Constitutional:  Negative for fever.  Musculoskeletal:  Positive for neck pain.    Physical Exam Updated Vital Signs BP 115/71 (BP Location: Left Arm)   Pulse 84   Temp 98.9 F (37.2 C) (Oral)   Resp 18   Ht  (1.6 m)   Wt 60.3 kg   LMP 09/06/2022   SpO2 97%   BMI 23.55 kg/m  Physical Exam Vitals and nursing note reviewed.  Constitutional:      General: She is not in  acute distress.    Appearance: She is well-developed.  HENT:     Head: Normocephalic and atraumatic.  Eyes:     Conjunctiva/sclera: Conjunctivae normal.  Neck:     Comments: TTP of R SCM, worse with flexion of neck and rotating neck to the R. ROM intact. No crepitus, erythema or edema.  Cardiovascular:     Rate and Rhythm: Normal rate and regular rhythm.     Heart sounds: No murmur heard. Pulmonary:     Effort: Pulmonary effort is normal. No respiratory distress.     Breath sounds: Normal breath sounds.  Abdominal:     Palpations: Abdomen is soft.     Tenderness: There is no abdominal tenderness.  Musculoskeletal:        General: No swelling.     Cervical back: Neck supple.  Skin:    General: Skin is warm and dry.     Capillary Refill: Capillary refill takes less than 2 seconds.  Neurological:     Mental Status: She is alert.  Psychiatric:        Mood and Affect: Mood normal.     ED Results / Procedures / Treatments   Labs (all labs ordered are listed, but only abnormal results are displayed) Labs  Reviewed - No data to display  EKG None  Radiology No results found.  Procedures Procedures    Medications Ordered in ED Medications - No data to display  ED Course/ Medical Decision Making/ A&P                             Medical Decision Making Patient is a 27 year old female, here for right neck pain that is going on for the last 2 days helps with Tiger balm..  Worse with flexion and rightward rotation.  I believe that her pain is likely secondary to a muscle strain/SCM tightness.  I again gave her exercises to help loosen her neck up as well as muscle relaxers, strict return precautions.  Discharged home.    Final Clinical Impression(s) / ED Diagnoses Final diagnoses:  Strain of neck muscle, initial encounter    Rx / DC Orders ED Discharge Orders          Ordered    cyclobenzaprine (FLEXERIL) 5 MG tablet  3 times daily PRN        09/20/22 1647               Janean Eischen, Harley Alto, PA 09/20/22 1648    Maia Plan, MD 09/21/22 1520

## 2022-09-20 NOTE — Discharge Instructions (Addendum)
Please follow-up with your primary care doctor, use exercises as instructed, to help with your cervical strain.  I believe that you just have some tightness in your SCM.  And stretching may help with this.  Additionally you can take muscle relaxers to help with the stiffness.  Use ibuprofen and Tylenol.

## 2022-09-20 NOTE — ED Notes (Signed)
Discharge paperwork reviewed entirely with patient, including Rx's and follow up care. Pain was under control. Pt verbalized understanding as well as all parties involved. No questions or concerns voiced at the time of discharge. No acute distress noted.   Pt ambulated out to PVA without incident or assistance.  

## 2022-09-20 NOTE — ED Triage Notes (Signed)
Pt states anterior right sided neck pain that has been painfull and tight for 10 days States it is worse at night and swells at night  Tiger balm has been helping it feel better

## 2022-10-01 ENCOUNTER — Ambulatory Visit: Payer: Medicaid Other

## 2022-10-01 ENCOUNTER — Other Ambulatory Visit (HOSPITAL_COMMUNITY)
Admission: RE | Admit: 2022-10-01 | Discharge: 2022-10-01 | Disposition: A | Discharge status: Home / Self Care | Payer: Medicaid Other | Source: Ambulatory Visit | Attending: Obstetrics and Gynecology | Admitting: Obstetrics and Gynecology

## 2022-10-01 VITALS — BP 124/74 | HR 60

## 2022-10-01 DIAGNOSIS — Z113 Encounter for screening for infections with a predominantly sexual mode of transmission: Secondary | ICD-10-CM | POA: Insufficient documentation

## 2022-10-01 NOTE — Progress Notes (Unsigned)
Patient request STD screening because of exposures of her partner.  Patient is not currently having any symptoms.  Will send patient for self swab and blood draw. Armandina Stammer RN

## 2022-10-02 LAB — HIV ANTIBODY (ROUTINE TESTING W REFLEX): HIV Screen 4th Generation wRfx: NONREACTIVE

## 2022-10-02 LAB — CERVICOVAGINAL ANCILLARY ONLY
Chlamydia: NEGATIVE
Comment: NEGATIVE
Comment: NEGATIVE
Comment: NORMAL
Neisseria Gonorrhea: NEGATIVE
Trichomonas: NEGATIVE

## 2022-10-02 LAB — RPR: RPR Ser Ql: NONREACTIVE

## 2022-10-02 LAB — HEPATITIS B SURFACE ANTIGEN: Hepatitis B Surface Ag: NEGATIVE

## 2022-10-02 LAB — HEPATITIS C ANTIBODY: Hep C Virus Ab: NONREACTIVE

## 2022-10-09 ENCOUNTER — Ambulatory Visit: Payer: Medicaid Other | Admitting: Student

## 2023-04-09 ENCOUNTER — Other Ambulatory Visit (HOSPITAL_COMMUNITY)
Admission: RE | Admit: 2023-04-09 | Discharge: 2023-04-09 | Disposition: A | Discharge status: Home / Self Care | Payer: Medicaid Other | Source: Ambulatory Visit | Attending: Family Medicine | Admitting: Family Medicine

## 2023-04-09 ENCOUNTER — Ambulatory Visit: Payer: Medicaid Other

## 2023-04-09 ENCOUNTER — Ambulatory Visit (INDEPENDENT_AMBULATORY_CARE_PROVIDER_SITE_OTHER): Payer: Medicaid Other

## 2023-04-09 VITALS — BP 118/70 | HR 76 | Wt 131.0 lb

## 2023-04-09 DIAGNOSIS — N898 Other specified noninflammatory disorders of vagina: Secondary | ICD-10-CM

## 2023-04-09 NOTE — Progress Notes (Signed)
SUBJECTIVE:  27 y.o. female complains of white vaginal discharge for 1.5 week(s). Denies abnormal vaginal bleeding or significant pelvic pain or fever. No UTI symptoms. Denies history of known exposure to STD.  Patient's last menstrual period was 04/01/2023.  OBJECTIVE:  She appears well, afebrile. Urine dipstick: not done.  ASSESSMENT:  Vaginal Discharge     PLAN:  GC, chlamydia, trichomonas, BVAG, CVAG probe sent to lab. Treatment: To be determined once lab results are received ROV prn if symptoms persist or worsen.  Lydie Stammen l Elvira Langston, CMA

## 2023-04-10 ENCOUNTER — Telehealth: Payer: Self-pay

## 2023-04-10 DIAGNOSIS — B9689 Other specified bacterial agents as the cause of diseases classified elsewhere: Secondary | ICD-10-CM

## 2023-04-10 DIAGNOSIS — B379 Candidiasis, unspecified: Secondary | ICD-10-CM

## 2023-04-10 LAB — CERVICOVAGINAL ANCILLARY ONLY
Bacterial Vaginitis (gardnerella): POSITIVE — AB
Candida Glabrata: NEGATIVE
Candida Vaginitis: POSITIVE — AB
Chlamydia: NEGATIVE
Comment: NEGATIVE
Comment: NEGATIVE
Comment: NEGATIVE
Comment: NEGATIVE
Comment: NEGATIVE
Comment: NORMAL
Neisseria Gonorrhea: NEGATIVE
Trichomonas: NEGATIVE

## 2023-04-10 MED ORDER — FLUCONAZOLE 150 MG PO TABS: ORAL_TABLET | ORAL | 0 refills | Status: DC

## 2023-04-10 MED ORDER — METRONIDAZOLE 500 MG PO TABS: 500.0000 mg | ORAL_TABLET | Freq: Two times a day (BID) | ORAL | 0 refills | Status: DC

## 2023-04-10 MED ORDER — FLUCONAZOLE 150 MG PO TABS: 150.0000 mg | ORAL_TABLET | Freq: Once | ORAL | 0 refills | Status: DC

## 2023-04-10 NOTE — Telephone Encounter (Signed)
-----   Message from Madlyn Frankel sent at 04/10/2023  3:30 PM EST ----- Regarding: Results Patient would like to discuss results and see what meds could help.   [phone]

## 2023-04-10 NOTE — Telephone Encounter (Signed)
Called patient back to make her aware that Flagyl  500 mg bid x 7 days was sent in for BV and Diflucan 150 mg PO once was sent in for yeast.  Advised patient to not drink alcohol while taking Flagyl because it can make her sick.Understanding was voiced. Ren Aspinall l Shashana Fullington, CMA

## 2023-04-29 ENCOUNTER — Other Ambulatory Visit: Payer: Self-pay | Admitting: Obstetrics & Gynecology

## 2023-04-29 DIAGNOSIS — B379 Candidiasis, unspecified: Secondary | ICD-10-CM

## 2023-05-15 ENCOUNTER — Ambulatory Visit: Payer: Medicaid Other | Admitting: Obstetrics & Gynecology

## 2023-05-22 ENCOUNTER — Encounter: Payer: Self-pay | Admitting: Obstetrics & Gynecology

## 2023-05-22 ENCOUNTER — Ambulatory Visit: Payer: Medicaid Other | Admitting: Obstetrics & Gynecology

## 2023-05-22 VITALS — BP 124/76 | HR 101 | Wt 124.0 lb

## 2023-05-22 DIAGNOSIS — Z3009 Encounter for other general counseling and advice on contraception: Secondary | ICD-10-CM

## 2023-05-22 DIAGNOSIS — Z3046 Encounter for surveillance of implantable subdermal contraceptive: Secondary | ICD-10-CM | POA: Diagnosis not present

## 2023-05-22 MED ORDER — NORETHINDRONE ACET-ETHINYL EST 1-20 MG-MCG PO TABS: 1.0000 | ORAL_TABLET | Freq: Every day | ORAL | 11 refills | Status: DC

## 2023-05-22 NOTE — Progress Notes (Signed)
History:  27 y.o. [id number] here today for removal of Nexplanon. It has been in for 3 years. Pt reports heavy menses with Nexplanon and she wants to try OCPs. She reports that she can readily take a pill daily. She has no contraindications to OCP use.    The following portions of the patient's history were reviewed and updated as appropriate: allergies, current medications, past family history, past medical history, past social history, past surgical history and problem list.  Review of Systems:  Pertinent items are noted in HPI.    Objective:  Physical Exam Blood pressure 124/76, pulse (!) 101, weight 124 lb (56.2 kg), not currently breastfeeding.  CONSTITUTIONAL: Well-developed, well-nourished female in no acute distress.  HENT:  Normocephalic, atraumatic EYES: Conjunctivae and EOM are normal. No scleral icterus.  NECK: Normal range of motion SKIN: Skin is warm and dry. No rash noted. Not diaphoretic.No pallor. NEUROLGIC: Alert and oriented to person, place, and time. Normal coordination.   Procedure: Patient given informed consent, she signed consent form. She has gained >40# since her Nexplanon was placed.  She wants it removed.  Appropriate time out taken.  Patient's left arm was prepped and draped in the usual sterile fashion. Patient was prepped with alcohol swab and then injected with 3 ml of 1 % lidocaine.  She was prepped with betadine.  A #15 blade was used to make a small incision over the Nexplanon rod.  Using curved forceps, the end of the rod was grasped and the scar tissue was removed and the device was removed intact.  There was minimal blood loss. The incision site was covered with guaze and a pressure bandage to reduce any bruising.  The patient tolerated the procedure well and was given post procedure instructions.    Assessment & Plan:  Medea was seen today for nexplanon removal.  Diagnoses and all orders for this visit:  Encounter for counseling regarding  contraception -     norethindrone-ethinyl estradiol (LOESTRIN 1/20, 21,) 1-20 MG-MCG tablet; Take 1 tablet by mouth daily.  Encounter for Nexplanon removal    F/u in 1 year or sooner prn   Tynia Wiers L. Harraway-Smith, M.D., [place]

## 2023-05-22 NOTE — Progress Notes (Signed)
Patient presents for nexplanon removal and would like birth control pills.

## 2023-05-22 NOTE — Patient Instructions (Signed)
 Oral Contraception Information  Oral contraceptive pills (OCPs) are medicines taken by mouth to prevent pregnancy. They work by:  Preventing the ovaries from releasing eggs.  Thickening mucus in the lower part of the uterus (cervix). This prevents sperm from entering the uterus.  Thinning the lining of the uterus (endometrium). This prevents a fertilized egg from attaching to the endometrium.  OCPs are highly effective when taken exactly as prescribed. However, OCPs do not prevent STIs (sexually transmitted infections). Using condoms while on an OCP can help prevent STIs.  What happens before starting OCPs?  Before you start taking OCPs:  You may have a physical exam, blood test, and Pap test.  Your health care provider will make sure you are a good candidate for oral contraception. OCPs are not a good option for certain women, such as:  Women who smoke and are older than age 95.  Women who have or have had certain conditions, such as:  A history of high blood pressure.  Deep vein thrombosis.  Pulmonary embolism.  Stroke.  Cardiovascular disease.  Peripheral vascular disease.  Ask your health care provider about the possible side effects of the OCP you may be prescribed. Be aware that it can take 2-3 months for your body to adjust to changes in hormone levels.  Types of oral contraception    Birth control pills contain the hormones estrogen and progestin (synthetic progesterone) or progestin only.  The combination pill  This type of pill contains estrogen and progestin hormones.  Conventional contraception pills come in packs of 21 or 28 pills.  Some packs with 28-day pills contain estrogen and progestin for the first 21-24 days. Hormone-free tablets, called placebos, are taken for the final 4-7 days. You should have menstrual bleeding during the time you take the placebos.  In packs with 21 tablets, you take no pills for 7 days. Menstrual bleeding occurs during these days. (Some people prefer taking a pill for 28  days to help establish a routine).  Extended-interval contraception pills come in packs of 91 pills. The first 84 tablets have both estrogen and progestin. The last 7 pills are placebos. Menstrual bleeding occurs during the placebo days. With this schedule, menstrual bleeding happens once every 3 months.  Continuous contraception pills come in packs of 28 pills. All pills in the pack contain estrogen and progestin. With this schedule, regular menstrual bleeding does not happen, but there may be spotting or irregular bleeding.  Progestin-only pills  This type of pill is often called the mini-pill and contains the progestin hormone only. It comes in packs of 28 pills. In some packs, the last 4 pills are placebos. The pill must be taken at the same time every day. This is very important to prevent pregnancy. Menstrual bleeding may not be regular or predictable.  What are the advantages?  Oral contraception provides reliable and continuous contraception if taken as directed. It may treat or decrease symptoms of:  Menstrual period cramps.  Irregular menstrual cycle or bleeding.  Heavy menstrual flow.  Abnormal uterine bleeding.  Acne, depending on the type of pill.  Polycystic ovarian syndrome (POS).  Endometriosis.  Iron deficiency anemia.  Premenstrual symptoms, including severe irritability, depression, or anxiety.  It also may:  Reduce the risk of endometrial and ovarian cancer.  Be used as emergency contraception.  Prevent ectopic pregnancies and infections of the fallopian tubes.  What can make OCPs less effective?  OCPs may be less effective if:  You forget to take the  pill every day. For progestin-only pills, it is especially important to take the pill at the same time each day. Even taking it 3 hours late can increase the risk of pregnancy.  You have a stomach or intestinal disease that reduces your body's ability to absorb the pill.  You take OCPs with other medicines that make OCPs less effective, such as  antibiotics, certain HIV medicines, and some seizure medicines.  You take expired OCPs.  You forget to restart the pill after 7 days of not taking it. This refers to the packs of 21 pills.  What are the side effects and risks?  OCPs can sometimes cause side effects, such as:  Headache.  Depression.  Trouble sleeping.  Nausea and vomiting.  Breast tenderness.  Irregular bleeding or spotting during the first several months.  Bloating or fluid retention.  Increase in blood pressure.  Combination pills may slightly increase the risk of:  Blood clots.  Heart attack.  Stroke.  Follow these instructions at home:  Follow instructions from your health care provider about how to start taking your first cycle of OCPs. Depending on when you start the pill, you may need to use a backup form of birth control, such as condoms, during the first week. Make sure you know what steps to take if you forget to take the pill.  Summary  Oral contraceptive pills (OCPs) are medicines taken by mouth to prevent pregnancy. They are highly effective when taken exactly as prescribed.  OCPs contain a combination of the hormones estrogen and progestin (synthetic progesterone) or progestin only.  Before you start taking the pill, you may have a physical exam, blood test, and Pap test. Your health care provider will make sure you are a good candidate for oral contraception.  The combination pill may come in a 21-day pack, a 28-day pack, or a 91-day pack. Progestin-only pills come in packs of 28 pills.  OCPs can sometimes cause side effects, such as headache, nausea, breast tenderness, or irregular bleeding.  This information is not intended to replace advice given to you by your health care provider. Make sure you discuss any questions you have with your health care provider.  Document Revised: 02/19/2020 Document Reviewed: 01/28/2020  Elsevier Patient Education  2024 [employer].

## 2023-06-21 ENCOUNTER — Other Ambulatory Visit: Payer: Self-pay

## 2023-06-21 ENCOUNTER — Emergency Department (HOSPITAL_BASED_OUTPATIENT_CLINIC_OR_DEPARTMENT_OTHER)
Admission: EM | Admit: 2023-06-21 | Discharge: 2023-06-21 | Disposition: A | Discharge status: Home / Self Care | Payer: Medicaid Other | Attending: Emergency Medicine | Admitting: Emergency Medicine

## 2023-06-21 ENCOUNTER — Encounter (HOSPITAL_BASED_OUTPATIENT_CLINIC_OR_DEPARTMENT_OTHER): Payer: Self-pay | Admitting: Emergency Medicine

## 2023-06-21 DIAGNOSIS — Z20822 Contact with and (suspected) exposure to covid-19: Secondary | ICD-10-CM | POA: Insufficient documentation

## 2023-06-21 DIAGNOSIS — M79601 Pain in right arm: Secondary | ICD-10-CM | POA: Diagnosis not present

## 2023-06-21 DIAGNOSIS — J029 Acute pharyngitis, unspecified: Secondary | ICD-10-CM | POA: Insufficient documentation

## 2023-06-21 DIAGNOSIS — R519 Headache, unspecified: Secondary | ICD-10-CM | POA: Diagnosis not present

## 2023-06-21 DIAGNOSIS — J111 Influenza due to unidentified influenza virus with other respiratory manifestations: Secondary | ICD-10-CM

## 2023-06-21 DIAGNOSIS — I10 Essential (primary) hypertension: Secondary | ICD-10-CM | POA: Insufficient documentation

## 2023-06-21 DIAGNOSIS — R059 Cough, unspecified: Secondary | ICD-10-CM | POA: Diagnosis present

## 2023-06-21 DIAGNOSIS — M791 Myalgia, unspecified site: Secondary | ICD-10-CM | POA: Diagnosis not present

## 2023-06-21 LAB — RESP PANEL BY RT-PCR (RSV, FLU A&B, COVID)  RVPGX2
Influenza A by PCR: NEGATIVE
Influenza B by PCR: NEGATIVE
Resp Syncytial Virus by PCR: NEGATIVE
SARS Coronavirus 2 by RT PCR: NEGATIVE

## 2023-06-21 LAB — GROUP A STREP BY PCR: Group A Strep by PCR: NOT DETECTED

## 2023-06-21 NOTE — ED Provider Notes (Signed)
[facility] Provider Note   CSN: [id number] Arrival date & time: 06/21/23  1525     History  Chief Complaint  Patient presents with   Cough    Stacie Harding is a 28 y.o. female.  With a history of hypertension, anemia, anxiety, depression presenting to the ED for evaluation of headache, sore throat, body aches.  Symptoms began this morning.  She reports her grandfather was recently diagnosed with COVID.  She reports her child also has similar symptoms.  She denies any shortness of breath.  No fevers.  She denies cough.  She reports a scratching sensation in her throat.  No difficulty swallowing.  She has been taking ibuprofen for her symptoms with some improvement.  She is also complaining of right upper extremity pain.  She states she had her Nexplanon removed approximately 1 month ago.  She has intermittent pains to the medial aspect of the upper extremity that radiate down to the hand.  She reports some difficulty with wrist extension as well.  She has an appointment with her primary care provider in the near future.   Cough Associated symptoms: headaches, myalgias and sore throat        Home Medications Prior to Admission medications   Medication Sig Start Date End Date Taking? Authorizing Provider  fluconazole (DIFLUCAN) 150 MG tablet Take 1 tablet by mouth once. Patient not taking: Reported on 05/22/2023 04/10/23   Willodean Rosenthal, MD  metroNIDAZOLE (FLAGYL) 500 MG tablet Take 1 tablet (500 mg total) by mouth 2 (two) times daily. 04/10/23   Willodean Rosenthal, MD  norethindrone-ethinyl estradiol (LOESTRIN 1/20, 21,) 1-20 MG-MCG tablet Take 1 tablet by mouth daily. 05/22/23   Willodean Rosenthal, MD      Allergies    Iodine    Review of Systems   Review of Systems  HENT:  Positive for sore throat.   Musculoskeletal:  Positive for myalgias.  Neurological:  Positive for headaches.  All other systems reviewed and  are negative.   Physical Exam Updated Vital Signs BP 117/81 (BP Location: Right Arm)   Pulse 73   Temp 98.4 F (36.9 C) (Oral)   Resp 18   Ht 5\' 3"  (1.6 m)   Wt 56.7 kg   LMP 06/21/2023 (Exact Date)   SpO2 99%   BMI 22.14 kg/m  Physical Exam Vitals and nursing note reviewed.  Constitutional:      General: She is not in acute distress.    Appearance: Normal appearance. She is normal weight. She is not ill-appearing.     Comments: Resting comfortably in chair  HENT:     Head: Normocephalic and atraumatic.     Mouth/Throat:     Mouth: Mucous membranes are moist.     Pharynx: Oropharynx is clear.     Comments: Mild posterior pharyngeal erythema.  No exudates.  Tonsils 1+ bilaterally.  Uvula midline.  No drooling, trismus or tripoding. Pulmonary:     Effort: Pulmonary effort is normal. No respiratory distress.     Breath sounds: No wheezing, rhonchi or rales.  Abdominal:     General: Abdomen is flat.  Musculoskeletal:        General: Normal range of motion.     Cervical back: Neck supple.     Comments: Grip strength 5 out of 5 in right upper extremity.  Sensation intact in all digits.  Capillary fill normal.  Normal range of motion in flexion and extension of the wrist.  Skin:    General: Skin is warm and dry.     Comments: Small incision to right medial upper arm well-healing.  No erythema or streaking.  Neurological:     Mental Status: She is alert and oriented to person, place, and time.  Psychiatric:        Mood and Affect: Mood normal.        Behavior: Behavior normal.     ED Results / Procedures / Treatments   Labs (all labs ordered are listed, but only abnormal results are displayed) Labs Reviewed  GROUP A STREP BY PCR  RESP PANEL BY RT-PCR (RSV, FLU A&B, COVID)  [id number]  CULTURE, GROUP A STREP [facility])    EKG None  Radiology No results found.  Procedures Procedures    Medications Ordered in ED Medications - No data to display  ED Course/ Medical  Decision Making/ A&P                                 Medical Decision Making This patient presents to the ED for concern of sore throat, headache, body aches, this involves an extensive number of treatment options, and is a complaint that carries with it a high risk of complications and morbidity.  The differential diagnosis includes flu, COVID, RSV, mononucleosis, other viral URI, strep pharyngitis, pneumonia  My initial workup includes respiratory panel, rapid strep, strep culture  Additional history obtained from: Nursing notes from this visit.  I ordered, reviewed and interpreted labs which include: Respiratory panel, rapid strep.  Negative.  Afebrile, hemodynamically stable.  28 year old female presenting to the ED for evaluation of headaches, sore throat, body aches.  She is wondering if she has COVID as her grandfather recently tested positive for COVID.  States her child is also ill with similar symptoms.  Respiratory panel negative.  Rapid strep negative.  Culture sent.  She appears very well on physical exam, will defer treatment of potential strep pharyngitis to culture results.  Overall suspect a viral upper respiratory infection.  She uses Claritin and Flonase daily and reports minimal congestion and rhinorrhea at this point.  She was educated on supportive care for symptoms and typical timeline.  Regarding her right upper arm pain, this has been present for the past month.  She reports intermittent pains and a shooting sensation down to her arms.  Her neurovascular status is intact, she has an appointment with her primary care provider soon.  She was strongly encouraged to follow-up regarding her symptoms and to continue taking ibuprofen.  Lower suspicion for infectious etiology versus DVT given her reassuring physical exam.  She was given return precautions.  Stable at discharge.  At this time there does not appear to be any evidence of an acute emergency medical condition and the  patient appears stable for discharge with appropriate outpatient follow up. Diagnosis was discussed with patient who verbalizes understanding of care plan and is agreeable to discharge. I have discussed return precautions with patient who verbalizes understanding. Patient encouraged to follow-up with their PCP within 1 week. All questions answered.  Note: Portions of this report may have been transcribed using voice recognition software. Every effort was made to ensure accuracy; however, inadvertent computerized transcription errors may still be present.         Final Clinical Impression(s) / ED Diagnoses Final diagnoses:  Influenza-like illness  Right arm pain    Rx / DC Orders ED Discharge  Orders     None         Michelle Piper, Cordelia Poche 06/21/23 1800    Tegeler, Canary Brim, MD 06/21/23 [phone]

## 2023-06-21 NOTE — Discharge Instructions (Signed)
You have been seen today for your complaint of upper respiratory infection, right arm pain. Your lab work was negative for flu, COVID, RSV, strep.  It may be too early for the viral panel to be positive. Your discharge medications include Alternate tylenol and ibuprofen for pain. You may alternate these every 4 hours. You may take up to 800 mg of ibuprofen at a time and up to 1000 mg of tylenol. Follow up with: Your primary care provider as scheduled Please seek immediate medical care if you develop any of the following symptoms: Feel pain or pressure in your chest. Have trouble breathing. Faint or feel like you will faint. Have severe and persistent vomiting. Feel confused or disoriented. At this time there does not appear to be the presence of an emergent medical condition, however there is always the potential for conditions to change. Please read and follow the below instructions.  Do not take your medicine if  develop an itchy rash, swelling in your mouth or lips, or difficulty breathing; call 911 and seek immediate emergency medical attention if this occurs.  You may review your lab tests and imaging results in their entirety on your MyChart account.  Please discuss all results of fully with your primary care provider and other specialist at your follow-up visit.  Note: Portions of this text may have been transcribed using voice recognition software. Every effort was made to ensure accuracy; however, inadvertent computerized transcription errors may still be present.

## 2023-06-21 NOTE — ED Triage Notes (Addendum)
Pt states she has been having HA, sore throat, cough, body aches since last night; Also c/o RT arm pain from where her Nexplanon implant was removed on 05/22/23

## 2023-06-26 ENCOUNTER — Ambulatory Visit: Payer: Medicaid Other | Admitting: Obstetrics & Gynecology

## 2023-09-25 ENCOUNTER — Other Ambulatory Visit: Payer: Self-pay | Admitting: Obstetrics & Gynecology

## 2023-09-25 DIAGNOSIS — B379 Candidiasis, unspecified: Secondary | ICD-10-CM

## 2023-09-27 ENCOUNTER — Telehealth: Payer: Self-pay

## 2023-09-27 DIAGNOSIS — N898 Other specified noninflammatory disorders of vagina: Secondary | ICD-10-CM

## 2023-09-27 MED ORDER — FLUCONAZOLE 150 MG PO TABS: 150.0000 mg | ORAL_TABLET | Freq: Once | ORAL | 0 refills | Status: AC

## 2023-09-27 NOTE — Telephone Encounter (Cosign Needed)
 Patient sent request for Diflucan .  Spoke with patient.  States she is having white discharge, denies itching.  Stated this was the same symptoms as prior yeast infection.  Rx sent to pharmacy.  Stacie Harding [employer]

## 2023-10-09 MED ORDER — FLUCONAZOLE 150 MG PO TABS: ORAL_TABLET | ORAL | 0 refills | Status: DC

## 2024-04-22 ENCOUNTER — Emergency Department (HOSPITAL_BASED_OUTPATIENT_CLINIC_OR_DEPARTMENT_OTHER)

## 2024-04-22 ENCOUNTER — Encounter (HOSPITAL_BASED_OUTPATIENT_CLINIC_OR_DEPARTMENT_OTHER): Payer: Self-pay | Admitting: Emergency Medicine

## 2024-04-22 ENCOUNTER — Other Ambulatory Visit: Payer: Self-pay

## 2024-04-22 ENCOUNTER — Emergency Department (HOSPITAL_BASED_OUTPATIENT_CLINIC_OR_DEPARTMENT_OTHER)
Admission: EM | Admit: 2024-04-22 | Discharge: 2024-04-22 | Disposition: A | Discharge status: Home / Self Care | Attending: Emergency Medicine | Admitting: Emergency Medicine

## 2024-04-22 DIAGNOSIS — O10011 Pre-existing essential hypertension complicating pregnancy, first trimester: Secondary | ICD-10-CM | POA: Diagnosis not present

## 2024-04-22 DIAGNOSIS — O99351 Diseases of the nervous system complicating pregnancy, first trimester: Secondary | ICD-10-CM | POA: Insufficient documentation

## 2024-04-22 DIAGNOSIS — Z3A08 8 weeks gestation of pregnancy: Secondary | ICD-10-CM | POA: Diagnosis not present

## 2024-04-22 DIAGNOSIS — G43909 Migraine, unspecified, not intractable, without status migrainosus: Secondary | ICD-10-CM | POA: Diagnosis not present

## 2024-04-22 DIAGNOSIS — Z8616 Personal history of COVID-19: Secondary | ICD-10-CM | POA: Insufficient documentation

## 2024-04-22 DIAGNOSIS — R519 Headache, unspecified: Secondary | ICD-10-CM | POA: Diagnosis present

## 2024-04-22 DIAGNOSIS — Z3A01 Less than 8 weeks gestation of pregnancy: Secondary | ICD-10-CM

## 2024-04-22 LAB — PREGNANCY, URINE: Preg Test, Ur: POSITIVE — AB

## 2024-04-22 LAB — HCG, QUANTITATIVE, PREGNANCY: hCG, Beta Chain, Quant, S: 92100 m[IU]/mL — ABNORMAL HIGH (ref <5)

## 2024-04-22 MED ORDER — DEXAMETHASONE SOD PHOSPHATE PF 10 MG/ML IJ SOLN
10.0000 mg | Freq: Once | INTRAMUSCULAR | Status: AC
  Administered 2024-04-22: 10 mg via INTRAVENOUS

## 2024-04-22 MED ORDER — DIPHENHYDRAMINE HCL 50 MG/ML IJ SOLN
25.0000 mg | Freq: Once | INTRAMUSCULAR | Status: AC
  Administered 2024-04-22: 25 mg via INTRAVENOUS
  Filled 2024-04-22: qty 1

## 2024-04-22 MED ORDER — SODIUM CHLORIDE 0.9 % IV BOLUS
1000.0000 mL | Freq: Once | INTRAVENOUS | Status: AC
  Administered 2024-04-22: 1000 mL via INTRAVENOUS

## 2024-04-22 MED ORDER — METOCLOPRAMIDE HCL 5 MG/ML IJ SOLN
10.0000 mg | Freq: Once | INTRAMUSCULAR | Status: AC
  Administered 2024-04-22: 10 mg via INTRAVENOUS
  Filled 2024-04-22: qty 2

## 2024-04-22 NOTE — ED Provider Notes (Signed)
 [place] [facility] Provider Note   CSN: [id number] Arrival date & time: 04/22/24  1714     Patient presents with: Headache   Stacie Harding is a 28 y.o. female.   Patient is a 28 year old female who presents with headache.  She said has been going intermittently for about a week.  She says a bifrontal type headache.  She denies any neck pain.  No recent head trauma.  No fevers.  She has had some photophobia.  She said her kids had a GI bug a few days ago and she had some nausea vomiting and diarrhea with that but that has resolved.  She does have a history of migraines and other headaches.  She said this headaches feel similar to her prior migrainous type headaches.  She normally takes medication at home and it goes away although she has had to come to the ED in the past she said about 3 years ago for a similar headache.  She denies any unusual symptoms with this headache.  Her mom was concerned because the vein in her left neck seem to be more prominent when she is having a headache but then it goes back down to normal.  She denies any discomfort in her neck.  No vision changes.  No speech deficits.  No facial numbness.  No numbness or weakness to her extremities.  She also said that she had the Nexplanon  removed in January and she has had some irregular periods with some intermittent spotting since that time.  She has had some intermittent abdominal cramping with the spotting but no ongoing abdominal pain.  No abnormal vaginal discharge other than the spotting.  No heavy bleeding.       Prior to Admission medications   Medication Sig Start Date End Date Taking? Authorizing Provider  fluconazole  (DIFLUCAN ) 150 MG tablet Take 1 tablet by mouth once. 10/09/23   Corene Coy, MD  metroNIDAZOLE  (FLAGYL ) 500 MG tablet Take 1 tablet (500 mg total) by mouth 2 (two) times daily. 04/10/23   Corene Coy, MD  norethindrone -ethinyl estradiol  (LOESTRIN 1/20, 21,) 1-20 MG-MCG tablet Take 1 tablet by mouth daily. 05/22/23   Corene Coy, MD    Allergies: Iodine    Review of Systems  Constitutional:  Negative for chills, diaphoresis, fatigue and fever.  HENT:  Negative for congestion, rhinorrhea and sneezing.   Eyes:  Positive for photophobia. Negative for pain and visual disturbance.  Respiratory:  Negative for cough, chest tightness and shortness of breath.   Cardiovascular:  Negative for chest pain and leg swelling.  Gastrointestinal:  Positive for diarrhea, nausea and vomiting. Negative for abdominal pain.  Genitourinary:  Positive for vaginal bleeding. Negative for difficulty urinating, flank pain, frequency and vaginal discharge.  Musculoskeletal:  Negative for arthralgias and back pain.  Skin:  Negative for rash.  Neurological:  Positive for headaches. Negative for dizziness, speech difficulty, weakness and numbness.    Updated Vital Signs BP 112/66   Pulse 93   Temp 98.5 F (36.9 C) (Oral)   Resp 18   SpO2 100%   Physical Exam Constitutional:      Appearance: She is well-developed.  HENT:     Head: Normocephalic and atraumatic.  Eyes:     Extraocular Movements: Extraocular movements intact.     Pupils: Pupils are equal, round, and reactive to light.     Comments: Fundi not well-visualized  Neck:     Comments: No obvious JVD.  No  bruits Cardiovascular:     Rate and Rhythm: Normal rate and regular rhythm.     Heart sounds: Normal heart sounds.  Pulmonary:     Effort: Pulmonary effort is normal. No respiratory distress.     Breath sounds: Normal breath sounds. No wheezing or rales.  Chest:     Chest wall: No tenderness.  Abdominal:     General: Bowel sounds are normal.     Palpations: Abdomen is soft.     Tenderness: There is no abdominal tenderness. There is no guarding or rebound.  Musculoskeletal:        General: Normal range of motion.     Cervical back: Normal range of motion and neck  supple.  Lymphadenopathy:     Cervical: No cervical adenopathy.  Skin:    General: Skin is warm and dry.     Findings: No rash.  Neurological:     Mental Status: She is alert and oriented to person, place, and time.     Comments: Motor 5/5 all extremities Sensation grossly intact to LT all extremities Finger to Nose intact, no pronator drift CN II-XII grossly intact       (all labs ordered are listed, but only abnormal results are displayed) Labs Reviewed  PREGNANCY, URINE - Abnormal; Notable for the following components:      Result Value   Preg Test, Ur POSITIVE (*)    All other components within normal limits  HCG, QUANTITATIVE, PREGNANCY - Abnormal; Notable for the following components:   hCG, Beta Chain, Quant, S 92,100 (*)    All other components within normal limits    EKG: None  Radiology: US  OB Limited Result Date: 04/22/2024 CLINICAL DATA:  Cramping and spotting. EXAM: OBSTETRIC <14 WK ULTRASOUND TECHNIQUE: Transabdominal ultrasound was performed for evaluation of the gestation as well as the maternal uterus and adnexal regions. COMPARISON:  March 23, 2022 FINDINGS: Intrauterine gestational sac: Single Yolk sac:  Visualized. Embryo:  Visualized. Cardiac Activity: Visualized. Heart Rate: 144 bpm CRL:   1.3 mm   7 w 3 d                  US  EDC: December 06, 2024 Subchorionic hemorrhage:  None visualized. Maternal uterus/adnexae: The right and left ovaries are visualized and are normal in appearance. No pelvic free fluid is seen. IMPRESSION: Single, viable intrauterine pregnancy at approximately 7 weeks and 3 days gestation by ultrasound evaluation. Electronically Signed   By: Suzen Dials M.D.   On: 04/22/2024 20:27     Procedures   Medications Ordered in the ED  sodium chloride  0.9 % bolus 1,000 mL (1,000 mLs Intravenous New Bag/Given 04/22/24 1844)  metoCLOPramide  (REGLAN ) injection 10 mg (10 mg Intravenous Given 04/22/24 1850)  diphenhydrAMINE  (BENADRYL )  injection 25 mg (25 mg Intravenous Given 04/22/24 1850)  dexamethasone (DECADRON) injection 10 mg (10 mg Intravenous Given 04/22/24 1850)                                    Medical Decision Making Amount and/or Complexity of Data Reviewed Labs: ordered. Radiology: ordered.  Risk Prescription drug management.   This patient presents to the ED for concern of headache, this involves an extensive number of treatment options, and is a complaint that carries with it a high risk of complications and morbidity.  I considered the following differential and admission for this acute, potentially life threatening condition.  The differential diagnosis includes subarachnoid hemorrhage/aneurysm, meningitis, migraine, hypertensive emergency, mass, stroke, dural sinus thrombosis  MDM:    Patient is a 28 year old who presents with a headache.  She has a history of migraines and this headache feels similar to her prior migrainous type headaches.  She does not have any neck pain fevers or other symptoms that would be more concerning for meningitis or subarachnoid hemorrhage.  She does not have any neurologic deficits.  She was given IV fluids and a migraine cocktail and overall is feeling much better.  Her pregnancy test was positive.  She has had a little bit of vaginal spotting intermittently with some irregular menstrual cycles.  Quant hCG was performed and pelvic ultrasound was performed.  This shows a single live intrauterine pregnancy, no visualized complications.  Patient's overall feeling better.  Her blood pressure has not been elevated.  Her headache has resolved.  She was discharged home in good condition.  She was encouraged to start taking a prenatal vitamin.  She was encouraged to have close follow-up with her OB/GYN.  Return precautions were given.  (Labs, imaging, consults)  Labs: I Ordered, and personally interpreted labs.  The pertinent results include: Positive pregnancy test, elevated quant  hCG  Imaging Studies ordered: I ordered imaging studies including pelvic ultrasound I independently visualized and interpreted imaging. I agree with the radiologist interpretation  Additional history obtained from  .  External records from outside source obtained and reviewed including    Cardiac Monitoring: The patient was not maintained on a cardiac monitor.  If on the cardiac monitor, I personally viewed and interpreted the cardiac monitored which showed an underlying rhythm of:    Reevaluation: After the interventions noted above, I reevaluated the patient and found that they have :improved  Social Determinants of Health:    Disposition: Discharged to home  Co morbidities that complicate the patient evaluation  Past Medical History:  Diagnosis Date   Anemia    Anxiety    COVID-19 02/17/2020   Depression    Hypertension      Medicines Meds ordered this encounter  Medications   sodium chloride  0.9 % bolus 1,000 mL   metoCLOPramide  (REGLAN ) injection 10 mg   diphenhydrAMINE  (BENADRYL ) injection 25 mg   dexamethasone  (DECADRON ) injection 10 mg    I have reviewed the patients home medicines and have made adjustments as needed  Problem List / ED Course: Problem List Items Addressed This Visit   None Visit Diagnoses       Migraine without status migrainosus, not intractable, unspecified migraine type    -  Primary     Less than [redacted] weeks gestation of pregnancy                    Final diagnoses:  Migraine without status migrainosus, not intractable, unspecified migraine type  Less than [redacted] weeks gestation of pregnancy    ED Discharge Orders     None          Lenor Hollering, MD 04/22/24 2135

## 2024-04-22 NOTE — ED Triage Notes (Signed)
 Pt reports regular period 11/2 to 11/6  has had spotting since.  Has abdominal cramping as well as migraine headache today.  Did come off of the nexplanon  since January

## 2024-04-22 NOTE — ED Notes (Signed)
 Headache started a week ago off and on.  Today all day 8/10

## 2024-04-22 NOTE — Discharge Instructions (Signed)
 Start taking a prenatal vitamin.  Make an appointment to have close follow-up with your OB/GYN.  Return to the emergency room if you have any worsening symptoms including abdominal pain, vaginal bleeding, worsening headache, vomiting or other worsening symptoms.

## 2024-05-12 ENCOUNTER — Other Ambulatory Visit: Payer: Self-pay

## 2024-05-12 ENCOUNTER — Ambulatory Visit

## 2024-05-12 DIAGNOSIS — O0991 Supervision of high risk pregnancy, unspecified, first trimester: Secondary | ICD-10-CM | POA: Diagnosis not present

## 2024-05-12 DIAGNOSIS — O0990 Supervision of high risk pregnancy, unspecified, unspecified trimester: Secondary | ICD-10-CM | POA: Insufficient documentation

## 2024-05-12 DIAGNOSIS — Z3A10 10 weeks gestation of pregnancy: Secondary | ICD-10-CM | POA: Diagnosis not present

## 2024-05-12 DIAGNOSIS — Z3A09 9 weeks gestation of pregnancy: Secondary | ICD-10-CM

## 2024-05-12 NOTE — Progress Notes (Signed)
 New OB Intake  I explained I am completing New OB Intake today. We discussed EDD of 12/09/2024, by Last Menstrual Period. Pt is G3P2002. I reviewed her allergies, medications and Medical/Surgical/OB history.    Patient Active Problem List   Diagnosis Date Noted   Supervision of high risk pregnancy, antepartum 05/12/2024   Nexplanon  insertion 06/27/2020   Indication for care in labor or delivery 06/26/2020   Severe episode of recurrent major depressive disorder, without psychotic features (HCC) 06/08/2020   Current moderate episode of major depressive disorder without prior episode (HCC)    COVID-19 virus infection 02/18/2020   Genetic carrier 01/05/2020   Abnormal genetic test during pregnancy 12/16/2019   Chronic hypertension affecting pregnancy 12/08/2019   Supervision of other normal pregnancy, antepartum 12/08/2019   LGSIL on Pap smear of cervix 09/13/2017   Vaginal delivery 08/05/2017   Left knee injury 02/21/2012    Concerns addressed today  Patient informed that the ultrasound is considered a limited obstetric ultrasound and is not intended to be a complete ultrasound exam.  Patient also informed that the ultrasound is not being completed with the intent of assessing for fetal or placental anomalies or any pelvic abnormalities. Explained that the purpose of today's ultrasound is to assess for viability.  Patient acknowledges the purpose of the exam and the limitations of the study.     Delivery Plans Plans to deliver at [facility]. Discussed the nature of our practice with multiple providers including residents and students. Due to the size of the practice, the delivering provider may not be the same as those providing prenatal care.   MyChart/Babyscripts MyChart access verified. I explained pt will have some visits in office and some virtually. Babyscripts app discussed and ordered.   Blood Pressure Cuff Blood pressure cuff discussed. Discussed to be used for virtual visits and or  if needed BP checks weekly.  Anatomy US  Explained first scheduled US  will be around 19 weeks.   Last Pap Diagnosis  Date Value Ref Range Status  12/08/2019   Final   - Negative for intraepithelial lesion or malignancy (NILM)    First visit review I reviewed new OB appt with patient. Explained pt will be seen by Dr. Eveline at first visit. Discussed [place] genetic screening with patient. Routine prenatal labs scheduled.    Stacie Harding, [place] 05/12/2024  2:35 PM

## 2024-05-14 ENCOUNTER — Telehealth: Payer: Self-pay

## 2024-05-14 NOTE — Telephone Encounter (Signed)
 Patient called requesting another ultrasound because she wants to make an announcement at Christmas. Patient states that she had a US  in the E.D and was not given a picture. Informed patient that she would have to have a medical reason to have another US . Understanding was voiced. [name] l [name], CMA

## 2024-05-26 ENCOUNTER — Other Ambulatory Visit (HOSPITAL_COMMUNITY)
Admission: RE | Admit: 2024-05-26 | Discharge: 2024-05-26 | Disposition: A | Discharge status: Home / Self Care | Source: Ambulatory Visit | Attending: Obstetrics and Gynecology | Admitting: Obstetrics and Gynecology

## 2024-05-26 ENCOUNTER — Other Ambulatory Visit

## 2024-05-26 DIAGNOSIS — Z113 Encounter for screening for infections with a predominantly sexual mode of transmission: Secondary | ICD-10-CM | POA: Insufficient documentation

## 2024-05-26 DIAGNOSIS — O0990 Supervision of high risk pregnancy, unspecified, unspecified trimester: Secondary | ICD-10-CM | POA: Diagnosis present

## 2024-05-26 DIAGNOSIS — Z3A11 11 weeks gestation of pregnancy: Secondary | ICD-10-CM | POA: Diagnosis present

## 2024-05-26 DIAGNOSIS — O0991 Supervision of high risk pregnancy, unspecified, first trimester: Secondary | ICD-10-CM | POA: Insufficient documentation

## 2024-05-27 LAB — GC/CHLAMYDIA PROBE AMP (~~LOC~~) NOT AT ARMC
Chlamydia: NEGATIVE
Comment: NEGATIVE
Comment: NORMAL
Neisseria Gonorrhea: NEGATIVE

## 2024-05-28 LAB — URINE CULTURE, OB REFLEX

## 2024-05-28 LAB — CULTURE, OB URINE

## 2024-05-31 ENCOUNTER — Other Ambulatory Visit: Payer: Self-pay

## 2024-05-31 ENCOUNTER — Emergency Department (HOSPITAL_COMMUNITY): Admission: EM | Admit: 2024-05-31 | Discharge: 2024-05-31 | Discharge status: Against Medical Advice

## 2024-05-31 ENCOUNTER — Inpatient Hospital Stay (HOSPITAL_COMMUNITY)
Admission: AD | Admit: 2024-05-31 | Discharge: 2024-05-31 | Disposition: A | Discharge status: Home / Self Care | Source: Home / Self Care | Attending: Obstetrics & Gynecology | Admitting: Obstetrics & Gynecology

## 2024-05-31 ENCOUNTER — Encounter (HOSPITAL_COMMUNITY): Payer: Self-pay | Admitting: Emergency Medicine

## 2024-05-31 DIAGNOSIS — R1030 Lower abdominal pain, unspecified: Secondary | ICD-10-CM | POA: Diagnosis not present

## 2024-05-31 DIAGNOSIS — O99511 Diseases of the respiratory system complicating pregnancy, first trimester: Secondary | ICD-10-CM

## 2024-05-31 DIAGNOSIS — R1110 Vomiting, unspecified: Secondary | ICD-10-CM | POA: Insufficient documentation

## 2024-05-31 DIAGNOSIS — Z5329 Procedure and treatment not carried out because of patient's decision for other reasons: Secondary | ICD-10-CM | POA: Insufficient documentation

## 2024-05-31 DIAGNOSIS — Z3A12 12 weeks gestation of pregnancy: Secondary | ICD-10-CM | POA: Diagnosis not present

## 2024-05-31 DIAGNOSIS — J111 Influenza due to unidentified influenza virus with other respiratory manifestations: Secondary | ICD-10-CM | POA: Diagnosis not present

## 2024-05-31 LAB — URINALYSIS, ROUTINE W REFLEX MICROSCOPIC
Bilirubin Urine: NEGATIVE
Glucose, UA: NEGATIVE mg/dL
Hgb urine dipstick: NEGATIVE
Ketones, ur: NEGATIVE mg/dL
Leukocytes,Ua: NEGATIVE
Nitrite: NEGATIVE
Protein, ur: NEGATIVE mg/dL
Specific Gravity, Urine: 1.017 (ref 1.005–1.030)
pH: 6 (ref 5.0–8.0)

## 2024-05-31 MED ORDER — OSELTAMIVIR PHOSPHATE 75 MG PO CAPS: 75.0000 mg | ORAL_CAPSULE | Freq: Two times a day (BID) | ORAL | 0 refills | Status: DC

## 2024-05-31 MED ORDER — ONDANSETRON 4 MG PO TBDP
4.0000 mg | ORAL_TABLET | Freq: Once | ORAL | Status: AC
  Administered 2024-05-31: 4 mg via ORAL
  Filled 2024-05-31: qty 1

## 2024-05-31 NOTE — ED Triage Notes (Addendum)
 Pt states was exposed to Flu on Christmas day and has been feeling weak, tired, sore throat and headache.  States she was vomiting yesterday and today.  States abdominal pain that started after the vomiting started. Pt denies vaginal bleeding or leaking of fluid.

## 2024-05-31 NOTE — ED Provider Triage Note (Cosign Needed Addendum)
 Emergency Medicine Provider Triage Evaluation Note  Stacie Harding , a 28 y.o. female  was evaluated in triage.  Pt complains of flulike symptoms.  Symptoms ongoing since yesterday, reports multiple episodes of vomiting today and yesterday with associated lower abdominal cramping.  Patient is [redacted] weeks pregnant, no vaginal bleeding or leakage of fluids.  No known fever at home, does have known sick contacts.  Review of Systems  Positive: As above Negative: As above  Physical Exam  BP 123/84 (BP Location: Right Arm)   Pulse 90   Temp 98.4 F (36.9 C) (Oral)   Resp 16   Ht 5' 3 (1.6 m)   Wt 55.8 kg   LMP 03/04/2024   SpO2 100%   BMI 21.79 kg/m  Gen:   Awake, no distress   Resp:  Normal effort  MSK:   Moves extremities without difficulty  Other:  Abdomen is soft, mild suprapubic tenderness on exam  Medical Decision Making  Medically screening exam initiated at 6:27 PM.  Appropriate orders placed.  Stacie Harding was informed that the remainder of the evaluation will be completed by another provider, this initial triage assessment does not replace that evaluation, and the importance of remaining in the ED until their evaluation is complete.   Spoke with provider at [facility]U who agrees to evaluate the patient, will transport patient to MAU now    Stacie Harding SAILOR, [place] 05/31/24 1830

## 2024-05-31 NOTE — MAU Provider Note (Signed)
 " History     CSN: [id number]  Arrival date and time: 05/31/24 8162   Event Date/Time   First Provider Initiated Contact with Patient 05/31/24 2121      Chief Complaint  Patient presents with   Nausea   Emesis   Abdominal Pain   Fatigue        Headache   Stacie Harding , a  28 y.o. G3P2002 at [redacted]w[redacted]d presents to MAU with desires to be tested for flu. Patient states that on christmas she was around 2 family members that tested positive for the flu this morning. She has been experiencing cold and flu like symptoms for the last 2 days. She has no other complaints.           OB History     Gravida  3   Para  2   Term  2   Preterm  0   AB  0   Living  2      SAB  0   IAB  0   Ectopic  0   Multiple  0   Live Births  2           Past Medical History:  Diagnosis Date   Anemia 2022   Anxiety    COVID-19 02/17/2020   Depression    Hypertension 2022    Past Surgical History:  Procedure Laterality Date   KNEE SURGERY  2012    Family History  Problem Relation Age of Onset   Asthma Son    Diabetes Maternal Grandmother    Hypertension Maternal Grandmother    Sudden death Neg Hx    Hyperlipidemia Neg Hx    Heart attack Neg Hx     Social History[1]  Allergies: Allergies[2]  Medications Prior to Admission  Medication Sig Dispense Refill Last Dose/Taking   Prenatal Vit-Fe Fumarate-FA (PRENATAL VITAMINS PO) Take 1 capsule by mouth daily.   05/30/2024   fluconazole  (DIFLUCAN ) 150 MG tablet Take 1 tablet by mouth once. (Patient not taking: Reported on 05/12/2024) 1 tablet 0    metroNIDAZOLE  (FLAGYL ) 500 MG tablet Take 1 tablet (500 mg total) by mouth 2 (two) times daily. 14 tablet 0    norethindrone -ethinyl estradiol (LOESTRIN 1/20, 21,) 1-20 MG-MCG tablet Take 1 tablet by mouth daily. (Patient not taking: Reported on 05/12/2024) 28 tablet 11     Review of Systems  Constitutional:  Positive for chills and fatigue. Negative for fever.  HENT:   Positive for congestion.   Eyes:  Negative for pain and visual disturbance.  Respiratory:  Positive for cough. Negative for apnea, shortness of breath and wheezing.   Cardiovascular:  Negative for chest pain and palpitations.  Gastrointestinal:  Negative for abdominal pain, constipation, diarrhea, nausea and vomiting.  Genitourinary:  Negative for difficulty urinating, dysuria, pelvic pain, vaginal bleeding, vaginal discharge and vaginal pain.  Musculoskeletal:  Positive for myalgias. Negative for back pain.  Neurological:  Positive for headaches. Negative for seizures and weakness.  Psychiatric/Behavioral:  Negative for suicidal ideas.    Physical Exam   Blood pressure 112/67, pulse 82, temperature 98.6 F (37 C), temperature source Oral, resp. rate 18, height 5' 3 (1.6 m), weight 58.5 kg, last menstrual period 03/04/2024, SpO2 99%.  Physical Exam Vitals and nursing note reviewed.  Constitutional:      General: She is not in acute distress.    Appearance: Normal appearance.  HENT:     Head: Normocephalic.  Pulmonary:     Effort: Pulmonary effort  is normal.  Musculoskeletal:     Cervical back: Normal range of motion.  Skin:    General: Skin is warm and dry.  Neurological:     Mental Status: She is alert and oriented to person, place, and time.  Psychiatric:        Mood and Affect: Mood normal.    FHT obtained in triage.   MAU Course  Procedures  MDM - Reviewed with patient that is safe to say   Assessment and Plan  ***  Stacie Harding 05/31/2024, 9:21 PM      [1]  Social History Tobacco Use   Smoking status: Never   Smokeless tobacco: Never  Vaping Use   Vaping status: Never Used  Substance Use Topics   Alcohol use: Not Currently   Drug use: Not Currently    Types: Marijuana  [2]  Allergies Allergen Reactions   Iodine Nausea And Vomiting and Swelling    This is a shellfish allergy noted 11/29/20    "

## 2024-05-31 NOTE — ED Triage Notes (Signed)
 Pt came to get tested for flu. C/O sore throat, body aches, and vomiting. Sx started a few days ago. Pt is 3 months pregnant. Axox4. C/O abd pain.

## 2024-05-31 NOTE — Discharge Instructions (Signed)

## 2024-05-31 NOTE — MAU Note (Signed)
 Stacie Harding is a 28 y.o. at [redacted]w[redacted]d here in MAU reporting: came in to see if she has the flu - N/V, fatigue/weakness, and lower abdominal cramping for 2 days. Emesis x2 today. Denies VB or abnormal discharge. Reports migraine for the past 3 days as well. Took extra strength tylenol  at 1330 - went to sleep afterwards and woke up in pain.   Onset of complaint: 2 days Pain score: 6 - abdomen; 8 - head Vitals:   05/31/24 1919  BP: 112/67  Pulse: 82  Resp: 18  Temp: 98.6 F (37 C)  SpO2: 99%     FHT: 161  Lab orders placed from triage: UA

## 2024-06-03 ENCOUNTER — Ambulatory Visit: Admitting: Obstetrics & Gynecology

## 2024-06-03 ENCOUNTER — Other Ambulatory Visit (HOSPITAL_COMMUNITY)
Admission: RE | Admit: 2024-06-03 | Discharge: 2024-06-03 | Disposition: A | Discharge status: Home / Self Care | Source: Ambulatory Visit | Attending: Obstetrics & Gynecology | Admitting: Obstetrics & Gynecology

## 2024-06-03 VITALS — BP 125/77 | HR 83 | Wt 127.0 lb

## 2024-06-03 DIAGNOSIS — Z3A13 13 weeks gestation of pregnancy: Secondary | ICD-10-CM | POA: Diagnosis present

## 2024-06-03 DIAGNOSIS — Z3480 Encounter for supervision of other normal pregnancy, unspecified trimester: Secondary | ICD-10-CM | POA: Insufficient documentation

## 2024-06-03 DIAGNOSIS — O285 Abnormal chromosomal and genetic finding on antenatal screening of mother: Secondary | ICD-10-CM

## 2024-06-03 NOTE — Progress Notes (Signed)
" °  Subjective:no complaint    Stacie Harding is a [id number] [redacted]w[redacted]d being seen today for her first obstetrical visit.  Her obstetrical history is significant for SMA carrier. Patient does intend to breast feed. Pregnancy history fully reviewed.  Patient reports no complaints.  Vitals:   06/03/24 1423  BP: 125/77  Pulse: 83  Weight: 127 lb (57.6 kg)    HISTORY: OB History  Gravida Para Term Preterm AB Living  3 2 2  0 0 2  SAB IAB Ectopic Multiple Live Births  0 0 0 0 2    # Outcome Date GA Lbr Len/2nd Weight Sex Type Anes PTL Lv  3 Current           2 Term 06/26/20 [redacted]w[redacted]d 01:13 / 00:15 6 lb 7 oz (2.92 kg) F Vag-Spont EPI  LIV     Birth Comments: none  1 Term 08/05/17 [redacted]w[redacted]d 06:34 / 00:19 8 lb 0.9 oz (3.655 kg) M Vag-Spont EPI  LIV   Past Medical History:  Diagnosis Date   Anemia 2022   Anxiety    COVID-19 02/17/2020   Depression    Hypertension 2022   Past Surgical History:  Procedure Laterality Date   KNEE SURGERY  2012   Family History  Problem Relation Age of Onset   Asthma Son    Diabetes Maternal Grandmother    Hypertension Maternal Grandmother    Sudden death Neg Hx    Hyperlipidemia Neg Hx    Heart attack Neg Hx      Exam    Uterus:   13 week  Pelvic Exam:    Perineum: No Hemorrhoids   Vulva: normal   Vagina:  normal mucosa   pH:    Cervix: no lesions   Adnexa: not evaluated   Bony Pelvis:   System: Breast:  Inspection negative   Skin: normal coloration and turgor, no rashes    Neurologic: oriented, normal mood   Extremities: normal strength, tone, and muscle mass   HEENT PERRLA   Mouth/Teeth mucous membranes moist, pharynx normal without lesions and dental hygiene good   Neck supple   Cardiovascular: regular rate and rhythm, no murmurs or gallops   Respiratory:  appears well, vitals normal, no respiratory distress, acyanotic, normal RR, chest clear, no wheezing, crepitations, rhonchi, normal symmetric air entry   Abdomen: soft, non-tender; bowel  sounds normal; no masses,  no organomegaly   Urinary: urethral meatus normal      Assessment:    Pregnancy: [id number] Patient Active Problem List   Diagnosis Date Noted   Supervision of high risk pregnancy, antepartum 05/12/2024   Genetic carrier 01/05/2020   Abnormal genetic test during pregnancy 12/16/2019   Chronic hypertension affecting pregnancy 12/08/2019        Plan:     Initial labs drawn. Prenatal vitamins. Problem list reviewed and updated. Genetic Screening discussed : ordered.  Ultrasound discussed; fetal survey: ordered.  Follow up in 4 weeks. 50% of 30 min visit spent on counseling and coordination of care.     Lynwood Solomons 06/03/2024   "

## 2024-06-03 NOTE — Addendum Note (Signed)
 Addended by: TANDA YORK CROME on: 06/03/2024 02:54 PM   Modules accepted: Orders

## 2024-06-04 LAB — COMPREHENSIVE METABOLIC PANEL WITH GFR
ALT: 10 IU/L (ref 0–32)
AST: 14 IU/L (ref 0–40)
Albumin: 4.2 g/dL (ref 4.0–5.0)
Alkaline Phosphatase: 68 IU/L (ref 41–116)
BUN/Creatinine Ratio: 15 (ref 9–23)
BUN: 10 mg/dL (ref 6–20)
Bilirubin Total: 0.4 mg/dL (ref 0.0–1.2)
CO2: 22 mmol/L (ref 20–29)
Calcium: 9.4 mg/dL (ref 8.7–10.2)
Chloride: 103 mmol/L (ref 96–106)
Creatinine, Ser: 0.68 mg/dL (ref 0.57–1.00)
Globulin, Total: 2.6 g/dL (ref 1.5–4.5)
Glucose: 65 mg/dL — ABNORMAL LOW (ref 70–99)
Potassium: 4.3 mmol/L (ref 3.5–5.2)
Sodium: 138 mmol/L (ref 134–144)
Total Protein: 6.8 g/dL (ref 6.0–8.5)
eGFR: 122 mL/min/1.73

## 2024-06-04 LAB — CBC/D/PLT+RPR+RH+ABO+RUBIGG...
Antibody Screen: NEGATIVE
Basophils Absolute: 0 x10E3/uL (ref 0.0–0.2)
Basos: 0 %
EOS (ABSOLUTE): 0 x10E3/uL (ref 0.0–0.4)
Eos: 1 %
HCV Ab: NONREACTIVE
HIV Screen 4th Generation wRfx: NONREACTIVE
Hematocrit: 38.5 % (ref 34.0–46.6)
Hemoglobin: 12.7 g/dL (ref 11.1–15.9)
Hepatitis B Surface Ag: NEGATIVE
Immature Grans (Abs): 0 x10E3/uL (ref 0.0–0.1)
Immature Granulocytes: 0 %
Lymphocytes Absolute: 0.9 x10E3/uL (ref 0.7–3.1)
Lymphs: 18 %
MCH: 31.5 pg (ref 26.6–33.0)
MCHC: 33 g/dL (ref 31.5–35.7)
MCV: 96 fL (ref 79–97)
Monocytes Absolute: 0.3 x10E3/uL (ref 0.1–0.9)
Monocytes: 6 %
Neutrophils Absolute: 3.9 x10E3/uL (ref 1.4–7.0)
Neutrophils: 75 %
Platelets: 248 x10E3/uL (ref 150–450)
RBC: 4.03 x10E6/uL (ref 3.77–5.28)
RDW: 12.4 % (ref 11.7–15.4)
RPR Ser Ql: NONREACTIVE
Rh Factor: POSITIVE
Rubella Antibodies, IGG: 9.04 {index}
WBC: 5.2 x10E3/uL (ref 3.4–10.8)

## 2024-06-04 LAB — HEMOGLOBIN A1C
Est. average glucose Bld gHb Est-mCnc: 88 mg/dL
Hgb A1c MFr Bld: 4.7 % — ABNORMAL LOW (ref 4.8–5.6)

## 2024-06-04 LAB — HCV INTERPRETATION

## 2024-06-05 ENCOUNTER — Ambulatory Visit: Payer: Self-pay

## 2024-06-05 NOTE — Telephone Encounter (Signed)
 FYI Only or Action Required?: FYI only for provider: appointment scheduled on 06/08/24.  Patient was last seen in primary care on not yet established.  Called Nurse Triage reporting Headache.  Symptoms began a week ago.  Interventions attempted: OTC medications: Tylenol .  Symptoms are: stable.  Triage Disposition: See PCP When Office is Open (Within 3 Days)  Patient/caregiver understands and will follow disposition?: Yes Reason for Disposition  [1] MILD-MODERATE headache AND [2] present > 3 days (72 hours)  Answer Assessment - Initial Assessment Questions Taking extra strength tylenol  and not touching headaches. Seen in ED thought she had the flu, thought it was related to the flu. Patient did not mention it to be OBGYN as thought it was just a flu symptom. Patient looking establish care with [facility]. New patient appointment at [facility]. Advised patient to call OBGYN regarding headaches as well.   1. LOCATION: Where does it hurt?      [place]  2. ONSET: When did the headache start? (e.g., minutes, hours or days)      6 weeks ago  3. PATTERN: Does the pain come and go, or has it been constant since it started?     Constant today since yesterday  4. SEVERITY: How bad is the pain? and What does it keep you from doing?  (e.g., Scale 1-10; mild, moderate, or severe)     8/10  5. RECURRENT SYMPTOM: Have you ever had headaches before? If Yes, ask: When was the last time? and What happened that time?      Yes, during first pregnancy, had HTN as well, managed with diet and tylenol   6. CAUSE: What do you think is causing the headache?     Pregnancy  7. MIGRAINE: Have you been diagnosed with migraine headaches? If Yes, ask: Is this headache similar?      Dx with first pregnancy, had no headaches second pregnancy, now this one is worse   8. HEAD INJURY: Has there been any recent injury to your head?      Denies  9. OTHER SYMPTOMS: Do you have any other  symptoms? (e.g., abdomen pain, blurred vision, fever, stiff neck; swelling of hands, face, or feet)     Blood sugar 65, denies other symptoms  10. PREGNANCY: How many weeks pregnant are you?       13 weeks  11. EDD: What date are you expecting to deliver?       December 09, 2024  Protocols used: Pregnancy [facility]  Copied from CRM [id number]. Topic: Clinical - Red Word Triage >> Jun 05, 2024 12:51 PM Cherylann RAMAN wrote: Red Word that prompted transfer to Nurse Triage: Patient is [redacted] weeks pregnant and she is having a lot of headaches. She states that her glucose levels are 65. Patient is not a [facility] patient.

## 2024-06-06 LAB — PROTEIN / CREATININE RATIO, URINE
Creatinine, Urine: 155.6 mg/dL
Protein, Ur: 20.7 mg/dL
Protein/Creat Ratio: 133 mg/g{creat} (ref 0–200)

## 2024-06-07 ENCOUNTER — Inpatient Hospital Stay (HOSPITAL_COMMUNITY)
Admission: AD | Admit: 2024-06-07 | Discharge: 2024-06-07 | Disposition: A | Discharge status: Home / Self Care | Payer: Self-pay

## 2024-06-07 ENCOUNTER — Other Ambulatory Visit: Payer: Self-pay

## 2024-06-07 DIAGNOSIS — O99351 Diseases of the nervous system complicating pregnancy, first trimester: Secondary | ICD-10-CM | POA: Diagnosis not present

## 2024-06-07 DIAGNOSIS — Z3A13 13 weeks gestation of pregnancy: Secondary | ICD-10-CM | POA: Diagnosis not present

## 2024-06-07 DIAGNOSIS — O99891 Other specified diseases and conditions complicating pregnancy: Secondary | ICD-10-CM | POA: Diagnosis not present

## 2024-06-07 DIAGNOSIS — O26891 Other specified pregnancy related conditions, first trimester: Secondary | ICD-10-CM | POA: Diagnosis present

## 2024-06-07 DIAGNOSIS — G44209 Tension-type headache, unspecified, not intractable: Secondary | ICD-10-CM | POA: Insufficient documentation

## 2024-06-07 DIAGNOSIS — Z8616 Personal history of COVID-19: Secondary | ICD-10-CM | POA: Insufficient documentation

## 2024-06-07 LAB — GLUCOSE, CAPILLARY: Glucose-Capillary: 78 mg/dL (ref 70–99)

## 2024-06-07 LAB — URINALYSIS, ROUTINE W REFLEX MICROSCOPIC
Bilirubin Urine: NEGATIVE
Glucose, UA: NEGATIVE mg/dL
Hgb urine dipstick: NEGATIVE
Ketones, ur: NEGATIVE mg/dL
Leukocytes,Ua: NEGATIVE
Nitrite: NEGATIVE
Protein, ur: NEGATIVE mg/dL
Specific Gravity, Urine: 1.023 (ref 1.005–1.030)
pH: 6 (ref 5.0–8.0)

## 2024-06-07 MED ORDER — ACETAMINOPHEN-CAFFEINE 500-65 MG PO TABS
2.0000 | ORAL_TABLET | Freq: Once | ORAL | Status: AC
  Administered 2024-06-07: 2 via ORAL
  Filled 2024-06-07: qty 2

## 2024-06-07 MED ORDER — EXCEDRIN TENSION HEADACHE 500-65 MG PO TABS: 2.0000 | ORAL_TABLET | Freq: Four times a day (QID) | ORAL | 0 refills | Status: AC | PRN

## 2024-06-07 MED ORDER — DIPHENHYDRAMINE HCL 50 MG/ML IJ SOLN
12.5000 mg | Freq: Once | INTRAMUSCULAR | Status: AC
  Administered 2024-06-07: 12.5 mg via INTRAVENOUS
  Filled 2024-06-07: qty 1

## 2024-06-07 MED ORDER — FAMOTIDINE IN NACL 20-0.9 MG/50ML-% IV SOLN
20.0000 mg | Freq: Once | INTRAVENOUS | Status: AC
  Administered 2024-06-07: 20 mg via INTRAVENOUS
  Filled 2024-06-07: qty 50

## 2024-06-07 MED ORDER — LACTATED RINGERS IV BOLUS
1000.0000 mL | Freq: Once | INTRAVENOUS | Status: AC
  Administered 2024-06-07: 1000 mL via INTRAVENOUS

## 2024-06-07 MED ORDER — METOCLOPRAMIDE HCL 5 MG/ML IJ SOLN
10.0000 mg | Freq: Once | INTRAMUSCULAR | Status: AC
  Administered 2024-06-07: 10 mg via INTRAVENOUS
  Filled 2024-06-07: qty 2

## 2024-06-07 NOTE — MAU Provider Note (Signed)
 Chief Complaint:  Headache   HPI   None     Stacie Harding is a 29 y.o. G3P2002 at [redacted]w[redacted]d who presents to maternity admissions reporting migraine and dizziness. Has been having migraines since she was pregnant. Has been taking tylenol  with no relief, last taken on Friday. Was last here on 05/31/24 and was given Tamiflu  for presumed flu, but symptoms didn't improve. Associated symptoms include eye watering, nausea, vomiting, fatigue, photophobia, and worsening with loud sounds.  Currently rating headache at 8/10 on the pain scale.   Had labs done last Wednesday showed glucose of 65. Today it is 22.  Pregnancy Course: Receives prenatal care at [facility].  Past Medical History:  Diagnosis Date   Anemia 2022   Anxiety    COVID-19 02/17/2020   Depression    Hypertension 2022   OB History  Gravida Para Term Preterm AB Living  3 2 2  0 0 2  SAB IAB Ectopic Multiple Live Births  0 0 0 0 2    # Outcome Date GA Lbr Len/2nd Weight Sex Type Anes PTL Lv  3 Current           2 Term 06/26/20 [redacted]w[redacted]d 01:13 / 00:15 2920 g F Vag-Spont EPI  LIV     Birth Comments: none  1 Term 08/05/17 [redacted]w[redacted]d 06:34 / 00:19 3655 g M Vag-Spont EPI  LIV   Past Surgical History:  Procedure Laterality Date   KNEE SURGERY  2012   Family History  Problem Relation Age of Onset   Asthma Son    Diabetes Maternal Grandmother    Hypertension Maternal Grandmother    Sudden death Neg Hx    Hyperlipidemia Neg Hx    Heart attack Neg Hx    Social History[1] Allergies[2] No medications prior to admission.    I have reviewed patient's Past Medical Hx, Surgical Hx, Family Hx, Social Hx, medications and allergies.   ROS  Pertinent items noted in HPI and remainder of comprehensive ROS otherwise negative.   PHYSICAL EXAM  Patient Vitals for the past 24 hrs:  BP Temp Temp src Pulse Resp SpO2 Height Weight  06/07/24 1541 117/69 98 F (36.7 C) Oral 82 18 100 % 5' 3 (1.6 m) 58.7 kg    Constitutional:  Well-developed, well-nourished female in no acute distress.  Cardiovascular: normal rate & rhythm, warm and well-perfused Respiratory: normal effort, no problems with respiration noted GI: Abd soft, non-tender, non-distended MS: Extremities nontender, no edema, normal ROM Neurologic: Alert and oriented x 4.     Labs: Results for orders placed or performed during the hospital encounter of 06/07/24 (from the past 24 hours)  Glucose, capillary     Status: None   Collection Time: 06/07/24  3:50 PM  Result Value Ref Range   Glucose-Capillary 78 70 - 99 mg/dL  Urinalysis, Routine w reflex microscopic -Urine, Clean Catch     Status: Abnormal   Collection Time: 06/07/24  5:00 PM  Result Value Ref Range   Color, Urine YELLOW YELLOW   APPearance HAZY (A) CLEAR   Specific Gravity, Urine 1.023 1.005 - 1.030   pH 6.0 5.0 - 8.0   Glucose, UA NEGATIVE NEGATIVE mg/dL   Hgb urine dipstick NEGATIVE NEGATIVE   Bilirubin Urine NEGATIVE NEGATIVE   Ketones, ur NEGATIVE NEGATIVE mg/dL   Protein, ur NEGATIVE NEGATIVE mg/dL   Nitrite NEGATIVE NEGATIVE   Leukocytes,Ua NEGATIVE NEGATIVE    Imaging:  No results found.  MDM & MAU COURSE  MDM:  Moderate  MAU Course: Orders Placed This Encounter  Procedures   Urinalysis, Routine w reflex microscopic -Urine, Clean Catch   Glucose, capillary   Discharge patient   Meds ordered this encounter  Medications   acetaminophen -caffeine  (EXCEDRIN  TENSION HEADACHE) 500-65 MG per tablet 2 tablet   lactated ringers  bolus 1,000 mL   famotidine  (PEPCID ) IVPB 20 mg premix   metoCLOPramide  (REGLAN ) injection 10 mg   diphenhydrAMINE  (BENADRYL ) injection 12.5 mg   acetaminophen -caffeine  (EXCEDRIN  TENSION HEADACHE) 500-65 MG TABS per tablet    Sig: Take 2 tablets by mouth every 6 (six) hours as needed.    Dispense:  60 tablet    Refill:  0   VSS on arrival. Exam unremarkable. Discussed dose of Excedrin  Tension to start, patient comfortable with plan.  545p -  Patient re-evaluated by Olam Boards CNM, noted to be uncomfortable and vomiting in trash can. Plan for benadryl , reglan , famotidine  and 1L LR bolus. 7p - Re-evaluated after medications and bolus complete. Patient feeling better eating crackers, rating headache at 2 out of 10 on pain scale. Comfortable with discharge. Prescription for Excedrin  Tension sent to preferred pharmacy. All questions answered prior to discharge. ASSESSMENT   1. [redacted] weeks gestation of pregnancy   2. Acute non intractable tension-type headache     PLAN  Discharge home in stable condition with return precautions.  Follow up with OB as scheduled.    Allergies as of 06/07/2024       Reactions   Iodine Nausea And Vomiting, Swelling   This is a shellfish allergy noted 11/29/20         Medication List     STOP taking these medications    fluconazole  150 MG tablet Commonly known as: DIFLUCAN    norethindrone -ethinyl estradiol 1-20 MG-MCG tablet Commonly known as: Loestrin 1/20 (21)   oseltamivir  75 MG capsule Commonly known as: TAMIFLU        TAKE these medications    Excedrin  Tension Headache 500-65 MG Tabs per tablet Generic drug: acetaminophen -caffeine  Take 2 tablets by mouth every 6 (six) hours as needed.   PRENATAL VITAMINS PO Take 1 capsule by mouth daily.        Charlie Courts, MD  Family Medicine - Obstetrics Fellow        [1]  Social History Tobacco Use   Smoking status: Never   Smokeless tobacco: Never  Vaping Use   Vaping status: Never Used  Substance Use Topics   Alcohol use: Not Currently   Drug use: Not Currently    Types: Marijuana  [2]  Allergies Allergen Reactions   Iodine Nausea And Vomiting and Swelling    This is a shellfish allergy noted 11/29/20

## 2024-06-07 NOTE — Discharge Instructions (Signed)
 You came into the hospital because you had a migraine.  We gave you Excedrin  tension, fluids, Reglan , and Benadryl .  Afterwards you are feeling better and you are able to eat some crackers.  For headaches, you can take 2 Excedrin  tension tablets every 6 hours as needed for headache.  I would also recommend that you stay very hydrated-your urine should be clear to pale yellow at the darkest.  Please follow-up with your PCP and OB providers as scheduled.

## 2024-06-07 NOTE — MAU Note (Signed)
 Stacie Harding is a 29 y.o. at [redacted]w[redacted]d here in MAU reporting: states she has a migraine and is feeling dizzy. Ongoing migraine for 1 week, has taken tylenol  with no relief. Dizziness for the last 2 days. Vomiting x1 today.  Denies any abd pain or vaginal bleeding.   OB appointment tomorrow with [facility] in [place]. Onset of complaint: ongoing Pain score: 8 Vitals:   06/07/24 1541  BP: 117/69  Pulse: 82  Resp: 18  Temp: 98 F (36.7 C)  SpO2: 100%     FHT:155 Lab orders placed from triage:  cbg, UA

## 2024-06-08 ENCOUNTER — Ambulatory Visit: Admitting: Family Medicine

## 2024-06-08 ENCOUNTER — Encounter: Payer: Self-pay | Admitting: Family Medicine

## 2024-06-08 VITALS — BP 116/68 | HR 70 | Ht 63.0 in | Wt 130.0 lb

## 2024-06-08 DIAGNOSIS — G44209 Tension-type headache, unspecified, not intractable: Secondary | ICD-10-CM | POA: Diagnosis not present

## 2024-06-08 DIAGNOSIS — E559 Vitamin D deficiency, unspecified: Secondary | ICD-10-CM | POA: Diagnosis not present

## 2024-06-08 DIAGNOSIS — Z862 Personal history of diseases of the blood and blood-forming organs and certain disorders involving the immune mechanism: Secondary | ICD-10-CM | POA: Insufficient documentation

## 2024-06-08 DIAGNOSIS — Z0000 Encounter for general adult medical examination without abnormal findings: Secondary | ICD-10-CM

## 2024-06-08 DIAGNOSIS — Z3A13 13 weeks gestation of pregnancy: Secondary | ICD-10-CM | POA: Diagnosis not present

## 2024-06-08 DIAGNOSIS — Z832 Family history of diseases of the blood and blood-forming organs and certain disorders involving the immune mechanism: Secondary | ICD-10-CM

## 2024-06-08 DIAGNOSIS — K0889 Other specified disorders of teeth and supporting structures: Secondary | ICD-10-CM

## 2024-06-08 LAB — CYTOLOGY - PAP: Diagnosis: NEGATIVE

## 2024-06-08 NOTE — Patient Instructions (Signed)
 Thank you for choosing [facility] at [facility] for your Primary Care needs. I am excited for the opportunity to partner with you to meet your health care goals. It was a pleasure meeting you today!  Information on diet, exercise, and health maintenance recommendations are listed below. This is information to help you be sure you are on track for optimal health and monitoring.   Please look over this and let us know if you have any questions or if you have completed any of the health maintenance outside of [facility] so that we can be sure your records are up to date.  ___________________________________________________________  MyChart:  For all urgent or time sensitive needs we ask that you please call the office to avoid delays. Our number is [phone]. MyChart is not constantly monitored and due to the large volume of messages a day, replies may take up to 72 business hours.  MyChart Policy: MyChart allows for you to see your visit notes, after visit summary, provider recommendations, lab and tests results, make an appointment, request refills, and contact your provider or the office for non-urgent questions or concerns. Providers are seeing patients during normal business hours and do not have built in time to review MyChart messages.  We ask that you allow a minimum of 3 business days for responses to [employer]. For this reason, please do not send urgent requests through MyChart. Please call the office at [phone]. New and ongoing conditions may require a visit. We have virtual and in-person visits available for your convenience.  Complex MyChart concerns may require a visit. Your provider may request you schedule a virtual or in-person visit to ensure we are providing the best care possible. MyChart messages sent after 11:00 AM on Friday may not be received by the provider until Monday morning.    Lab and Test Results: You will receive your lab and test  results on MyChart as soon as they are completed and results have been sent by the lab or testing facility. Due to this service, you will receive your results BEFORE your provider.  I review lab and test results each morning prior to seeing patients. Some results require collaboration with other providers to ensure you are receiving the most appropriate care. For this reason, we ask that you please allow a minimum of 3-5 business days from the time that ALL results have been received for your provider to receive and review lab and test results and contact you about these.  Most lab and test result comments from the provider will be sent through MyChart. Your provider may recommend changes to the plan of care, follow-up visits, repeat testing, ask questions, or request an office visit to discuss these results. You may reply directly to this message or call the office to provide information for the provider or set up an appointment. In some instances, you will be called with test results and recommendations. Please let us know if this is preferred and we will make note of this in your chart to provide this for you.    If you have not heard a response to your lab or test results in 5 business days from all results returning to MyChart, please call the office to let us know. We ask that you please avoid calling prior to this time unless there is an emergent concern. Due to high call volumes, this can delay the resulting process.  After Hours: For all non-emergency after hours needs, please  call the office at [phone] and select the option to reach the on-call  service. On-call services are shared between multiple [place] offices and therefore it will not be possible to speak directly with your provider. On-call providers may provide medical advice and recommendations, but are unable to provide refills for maintenance medications.  For all emergency or urgent medical needs after normal business hours, we  recommend that you seek care at the closest Urgent Care or Emergency Department to ensure appropriate treatment in a timely manner.  [facility] has a 24 hour emergency room located on the ground floor for your convenience.   Urgent Concerns During the Business Day Providers are seeing patients from 8AM to 5PM with a busy schedule and are most often not able to respond to non-urgent calls until the end of the day or the next business day. If you should have URGENT concerns during the day, please call and speak to the nurse or schedule a same day appointment so that we can address your concern without delay.   Thank you, again, for choosing me as your health care partner. I appreciate your trust and look forward to learning more about you!   Lollie Marrow Reola Calkins, DNP, FNP-C  ___________________________________________________________  Health Maintenance Recommendations Screening Testing Mammogram Every 1-2 years based on history and risk factors Starting at age 29 Pap Smear Ages 21-39 every 3 years Ages 22-65 every 5 years with HPV testing More frequent testing may be required based on results and history Colon Cancer Screening Every 1-10 years based on test performed, risk factors, and history Starting at age 29 Bone Density Screening Every 2-10 years based on history Starting at age 29 for women Recommendations for men differ based on medication usage, history, and risk factors AAA Screening One time ultrasound Men 59-29 years old who have ever smoked Lung Cancer Screening Low Dose Lung CT every 12 months Age 29-80 years with a 20 pack-year smoking history who still smoke or who have quit within the last 15 years  Screening Labs Routine  Labs: Complete Blood Count (CBC), Complete Metabolic Panel (CMP), Cholesterol (Lipid Panel) Every 6-12 months based on history and medications May be recommended more frequently based on current conditions or previous results Hemoglobin  A1c Lab Every 3-12 months based on history and previous results Starting at age 29 or earlier with diagnosis of diabetes, high cholesterol, BMI >26, and/or risk factors Frequent monitoring for patients with diabetes to ensure blood sugar control Thyroid Panel  Every 6 months based on history, symptoms, and risk factors May be repeated more often if on medication HIV One time testing for all patients 66 and older May be repeated more frequently for patients with increased risk factors or exposure Hepatitis C One time testing for all patients 59 and older May be repeated more frequently for patients with increased risk factors or exposure Gonorrhea, Chlamydia Every 12 months for all sexually active persons 13-24 years Additional monitoring may be recommended for those who are considered high risk or who have symptoms PSA Men 69-17 years old with risk factors Additional screening may be recommended from age 43-69 based on risk factors, symptoms, and history  Vaccine Recommendations Tetanus Booster All adults every 10 years Flu Vaccine All patients 6 months and older every year COVID Vaccine All patients 12 years and older Initial dosing with booster May recommend additional booster based on age and health history HPV Vaccine 2 doses all patients age 71-26 Dosing may be considered  for patients over 26 Shingles Vaccine (Shingrix) 2 doses all adults 50 years and older Pneumonia (Pneumovax 23) All adults 65 years and older May recommend earlier dosing based on health history Pneumonia (Prevnar 53) All adults 65 years and older Dosed 1 year after Pneumovax 23 Pneumonia (Prevnar 20) All adults 65 years and older (adults 19-64 with certain conditions or risk factors) 1 dose  For those who have not received Prevnar 13 vaccine previously   Additional Screening, Testing, and Vaccinations may be recommended on an individualized basis based on family history, health history, risk  factors, and/or exposure.  __________________________________________________________  Diet Recommendations for All Patients  I recommend that all patients maintain a diet low in saturated fats, carbohydrates, and cholesterol. While this can be challenging at first, it is not impossible and small changes can make big differences.  Things to try: Decreasing the amount of soda, sweet tea, and/or juice to one or less per day and replace with water While water is always the first choice, if you do not like water you may consider adding a water additive without sugar to improve the taste other sugar free drinks Replace potatoes with a brightly colored vegetable  Use healthy oils, such as canola oil or olive oil, instead of butter or hard margarine Limit your bread intake to two pieces or less a day Replace regular pasta with low carb pasta options Bake, broil, or grill foods instead of frying Monitor portion sizes  Eat smaller, more frequent meals throughout the day instead of large meals  An important thing to remember is, if you love foods that are not great for your health, you don't have to give them up completely. Instead, allow these foods to be a reward when you have done well. Allowing yourself to still have special treats every once in a while is a nice way to tell yourself thank you for working hard to keep yourself healthy.   Also remember that every day is a new day. If you have a bad day and "fall off the wagon", you can still climb right back up and keep moving along on your journey!  We have resources available to help you!  Some websites that may be helpful include: www.http://www.wall-moore.info/  Www.VeryWellFit.com _____________________________________________________________  Activity Recommendations for All Patients  I recommend that all adults get at least 30 minutes of moderate physical activity that elevates your heart rate at least 5 days out of the week.  Some examples  include: Walking or jogging at a pace that allows you to carry on a conversation Cycling (stationary bike or outdoors) Water aerobics Yoga Weight lifting Dancing If physical limitations prevent you from putting stress on your joints, exercise in a pool or seated in a chair are excellent options.  Do determine your MAXIMUM heart rate for activity: 220 - YOUR AGE = MAX Heart Rate   Remember! Do not push yourself too hard.  Start slowly and build up your pace, speed, weight, time in exercise, etc.  Allow your body to rest between exercise and get good sleep. You will need more water than normal when you are exerting yourself. Do not wait until you are thirsty to drink. Drink with a purpose of getting in at least 8, 8 ounce glasses of water a day plus more depending on how much you exercise and sweat.    If you begin to develop dizziness, chest pain, abdominal pain, jaw pain, shortness of breath, headache, vision changes, lightheadedness, or other concerning symptoms,  stop the activity and allow your body to rest. If your symptoms are severe, seek emergency evaluation immediately. If your symptoms are concerning, but not severe, please let us know so that we can recommend further evaluation.

## 2024-06-08 NOTE — Progress Notes (Signed)
 "   New Patient Office Visit   Subjective     Patient ID: Stacie Harding, female   DOB: 09/25/1995  Age: 29 y.o. MRN: [id number]   CC:  Chief Complaint  Patient presents with   Establish Care      HPI Stacie Harding presents to establish care. She lives with her mom and 37 year old son, 31 year old daughter, and is currently 13-weeks pregnant. She works as a child psychotherapist.    Discussed the use of AI scribe software for clinical note transcription with the patient, who gave verbal consent to proceed.  History of Present Illness Stacie Harding is a 29 year old female who presents with persistent headaches during her first trimester of pregnancy.  She is experiencing persistent headaches that began with the onset of her pregnancy. The headaches are described as a tightening band around her head, occurring intermittently, and have necessitated three hospital visits. She currently rates her pain as 2 out of 10. She has not yet initiated treatment with the prescribed Excedrin  from yesterdays ED visit.  She is [redacted] weeks pregnant and has a history of anemia, anxiety, depression, and high blood pressure. Her current medications include prenatal vitamins, and she was recently prescribed Excedrin  for her headaches. She has a known allergy to iodine.  She reports a cracked tooth from about a week ago, which has been bothersome. There is no associated swelling, pus, or drainage.  During this pregnancy, she has experienced nausea, breast tenderness, and fatigue, but no vomiting. She has a history of morning sickness in previous pregnancies.  Her family history includes a brother with seizures and a grandmother with diabetes, high blood pressure, and thalassemia. She is concerned about thalassemia and has been told to get tested.  She lives with her mother and children. She is currently not working but plans to return to her job as a child psychotherapist in two weeks. She denies alcohol, nicotine, or drug use, especially  during pregnancy.  Recent lab work showed slightly low blood sugar levels, but her A1c was normal, and she is not near diabetes. Her hemoglobin was recently within normal limits, and she is taking prenatal vitamins with iron.           Show/hide medication list[1] Past Medical History:  Diagnosis Date   Anemia 2022   Anxiety    COVID-19 02/17/2020   Depression    Hypertension 2022    Past Surgical History:  Procedure Laterality Date   KNEE SURGERY  2012     Family History  Problem Relation Age of Onset   Healthy Mother    Healthy Father    Seizures Brother    Diabetes Maternal Grandmother    Hypertension Maternal Grandmother    Thalassemia Maternal Grandmother    Asthma Son    Sudden death Neg Hx    Hyperlipidemia Neg Hx    Heart attack Neg Hx     Social History   Socioeconomic History   Marital status: Single    Spouse name: Not on file   Number of children: 2   Years of education: Not on file   Highest education level: Not on file  Occupational History   Occupation: Comptroller  Tobacco Use   Smoking status: Never   Smokeless tobacco: Never  Vaping Use   Vaping status: Never Used  Substance and Sexual Activity   Alcohol use: Not Currently   Drug use: Not Currently   Sexual activity: Not Currently    Birth  control/protection: None  Other Topics Concern   Not on file  Social History Narrative   ** Merged History Encounter **       Social Drivers of Health   Tobacco Use: Low Risk (06/08/2024)   Patient History    Smoking Tobacco Use: Never    Smokeless Tobacco Use: Never    Passive Exposure: Not on file  Financial Resource Strain: Not on file  Food Insecurity: Not on file  Transportation Needs: Not on file  Physical Activity: Not on file  Stress: Not on file  Social Connections: Unknown (10/16/2021)   Received [facility]   Social Network    Social Network: Not on file  Depression (PHQ2-9): Low Risk (06/08/2024)   Depression (PHQ2-9)     PHQ-2 Score: 3  Alcohol Screen: Not on file  Housing: Not on file  Utilities: Not on file  Health Literacy: Not on file       ROS All review of systems negative except what is listed in the HPI    Objective     BP 116/68   Pulse 70   Ht 5' 3 (1.6 m)   Wt 130 lb (59 kg)   LMP 03/04/2024   SpO2 100%   BMI 23.03 kg/m   Physical Exam Vitals reviewed.  Constitutional:      Appearance: Normal appearance.  HENT:     Mouth/Throat:     Dentition: Dental caries present. No gingival swelling or dental abscesses.   Cardiovascular:     Rate and Rhythm: Normal rate and regular rhythm.  Pulmonary:     Effort: Pulmonary effort is normal.     Breath sounds: Normal breath sounds.  Skin:    General: Skin is warm and dry.  Neurological:     Mental Status: She is alert and oriented to person, place, and time.  Psychiatric:        Mood and Affect: Mood normal.        Behavior: Behavior normal.        Thought Content: Thought content normal.        Judgment: Judgment normal.           Assessment & Plan:     Problem List Items Addressed This Visit       Active Problems   Vitamin D  deficiency   No current supplement outside of prenatal vitamins. Labs today.       Relevant Orders   VITAMIN D  25 Hydroxy (Vit-D Deficiency, Fractures)   History of anemia - Primary   Anemia previously with low hemoglobin, currently managed. Family history of thalassemia, genetic testing pending. - Ordered electrophoresis to evaluate for thalassemia. - Checked iron levels to monitor for any abnormalities. - Continue prenatal vitamins      Relevant Orders   Hgb Fractionation Cascade   IBC + Ferritin   Other Visit Diagnoses       Encounter for medical examination to establish care          Family history of thalassemia     See above   Relevant Orders   Hgb Fractionation Cascade   IBC + Ferritin     Tooth pain     Cracked tooth present for a week, no infection signs, potential  headache contributor. Looks like related to a cavity.  - Schedule an appointment with a dentist for evaluation and treatment. - Monitor for signs of infection such as swelling, drainage, increased pain, or fever.      [redacted]  weeks gestation of pregnancy     [redacted] weeks pregnant with typical early pregnancy symptoms. Blood pressure controlled, normal A1c, stable hemoglobin, no infection or complications. - Continue prenatal vitamins - Ensure adequate hydration and regular meals. - Monitor for signs of infection or complications. - Follow up with OB as scheduled       Tension-type headache, not intractable, unspecified chronicity pattern     Headaches likely tension-type due to hormonal changes, pain level 2/10 today. Limited medication options due to pregnancy, possible dental contribution. - Pick up and try Excedrin  for headache relief - prescribed yesterday at maternity admissions unit - Focus on hydration and regular meals. - Consider heating pad or ice pack to neck for muscle tension relief. - Schedule an eye exam to rule out vision-related triggers. - Follow up with OB for potential use of muscle relaxers if headaches persist after first trimester.             Return for physical - Fall 2026.  Waddell KATHEE Mon, NP  I,Emily Lagle,acting as a scribe for Waddell KATHEE Mon, NP.,have documented all relevant documentation on the behalf of Waddell KATHEE Mon, NP.  I, Waddell KATHEE Mon, NP, have reviewed all documentation for this visit. The documentation on 06/08/2024 for the exam, diagnosis, procedures, and orders are all accurate and complete.     [1]  Outpatient Medications Prior to Visit  Medication Sig   acetaminophen -caffeine  (EXCEDRIN  TENSION HEADACHE) 500-65 MG TABS per tablet Take 2 tablets by mouth every 6 (six) hours as needed.   Prenatal Vit-Fe Fumarate-FA (PRENATAL VITAMINS PO) Take 1 capsule by mouth daily.   No facility-administered medications prior to visit.   "

## 2024-06-08 NOTE — Assessment & Plan Note (Signed)
 Anemia previously with low hemoglobin, currently managed. Family history of thalassemia, genetic testing pending. - Ordered electrophoresis to evaluate for thalassemia. - Checked iron levels to monitor for any abnormalities. - Continue prenatal vitamins

## 2024-06-08 NOTE — Assessment & Plan Note (Signed)
 No current supplement outside of prenatal vitamins. Labs today.

## 2024-06-09 LAB — IBC + FERRITIN
Ferritin: 18.6 ng/mL (ref 10.0–291.0)
Iron: 165 ug/dL — ABNORMAL HIGH (ref 42–145)
Saturation Ratios: 35.2 % (ref 20.0–50.0)
TIBC: 469 ug/dL — ABNORMAL HIGH (ref 250.0–450.0)
Transferrin: 335 mg/dL (ref 212.0–360.0)

## 2024-06-09 LAB — PANORAMA PRENATAL TEST FULL PANEL:PANORAMA TEST PLUS 5 ADDITIONAL MICRODELETIONS: FETAL FRACTION: 9.6

## 2024-06-09 LAB — VITAMIN D 25 HYDROXY (VIT D DEFICIENCY, FRACTURES): VITD: 11.3 ng/mL — ABNORMAL LOW (ref 30.00–100.00)

## 2024-06-10 LAB — HGB FRACTIONATION CASCADE
Hgb A2: 2.6 % (ref 1.8–3.2)
Hgb A: 97.4 % (ref 96.4–98.8)
Hgb F: 0 % (ref 0.0–2.0)
Hgb S: 0 %

## 2024-06-11 ENCOUNTER — Ambulatory Visit: Payer: Self-pay | Admitting: Family Medicine

## 2024-06-11 ENCOUNTER — Other Ambulatory Visit

## 2024-06-11 DIAGNOSIS — Z832 Family history of diseases of the blood and blood-forming organs and certain disorders involving the immune mechanism: Secondary | ICD-10-CM

## 2024-06-11 DIAGNOSIS — E559 Vitamin D deficiency, unspecified: Secondary | ICD-10-CM

## 2024-06-11 DIAGNOSIS — Z862 Personal history of diseases of the blood and blood-forming organs and certain disorders involving the immune mechanism: Secondary | ICD-10-CM

## 2024-06-11 MED ORDER — VITAMIN D (ERGOCALCIFEROL) 1.25 MG (50000 UNIT) PO CAPS: 50000.0000 [IU] | ORAL_CAPSULE | ORAL | 0 refills | Status: AC

## 2024-06-12 LAB — SPECIMEN STATUS REPORT

## 2024-06-13 LAB — ALPHA-THALASSEMIA ANALYSIS

## 2024-06-13 LAB — HORIZON CUSTOM: REPORT SUMMARY: POSITIVE — AB

## 2024-06-15 ENCOUNTER — Ambulatory Visit: Payer: Self-pay | Admitting: *Deleted

## 2024-06-16 ENCOUNTER — Telehealth: Payer: Self-pay | Admitting: Family Medicine

## 2024-06-16 NOTE — Telephone Encounter (Signed)
 Stacie Harding left a voicemail with patient to return a call about a re doing a blood draw for a canceled test done at [facility]. Patient may call this office back or [facility] office I'm not sure.

## 2024-06-17 ENCOUNTER — Ambulatory Visit: Payer: Self-pay | Admitting: Obstetrics & Gynecology

## 2024-06-25 ENCOUNTER — Other Ambulatory Visit: Payer: Self-pay

## 2024-06-25 DIAGNOSIS — B9689 Other specified bacterial agents as the cause of diseases classified elsewhere: Secondary | ICD-10-CM

## 2024-06-25 MED ORDER — METRONIDAZOLE 500 MG PO TABS: 500.0000 mg | ORAL_TABLET | Freq: Two times a day (BID) | ORAL | 0 refills | Status: AC

## 2024-07-01 ENCOUNTER — Encounter: Admitting: Family Medicine

## 2024-07-09 ENCOUNTER — Ambulatory Visit: Admitting: Family Medicine

## 2024-07-09 VITALS — BP 117/64 | HR 83 | Wt 134.0 lb

## 2024-07-09 DIAGNOSIS — L299 Pruritus, unspecified: Secondary | ICD-10-CM

## 2024-07-09 DIAGNOSIS — O0990 Supervision of high risk pregnancy, unspecified, unspecified trimester: Secondary | ICD-10-CM

## 2024-07-09 DIAGNOSIS — Z3A18 18 weeks gestation of pregnancy: Secondary | ICD-10-CM

## 2024-07-09 DIAGNOSIS — O10919 Unspecified pre-existing hypertension complicating pregnancy, unspecified trimester: Secondary | ICD-10-CM

## 2024-07-09 MED ORDER — ASPIRIN 81 MG PO TBEC: 81.0000 mg | DELAYED_RELEASE_TABLET | Freq: Every day | ORAL | 2 refills | Status: AC

## 2024-07-09 NOTE — Progress Notes (Unsigned)
 "  PRENATAL VISIT NOTE  Subjective:  Stacie Harding is a 29 y.o. G3P2002 at [redacted]w[redacted]d being seen today for ongoing prenatal care.  She is currently monitored for the following issues for this high-risk pregnancy and has Chronic hypertension affecting pregnancy; Abnormal genetic test during pregnancy; Genetic carrier; Supervision of high risk pregnancy, antepartum; Vitamin D  deficiency; and History of anemia on their problem list.  Patient reports contractions since a couple days ago and itching for the last week.  Contractions are not regular and are mostly at night. Has about 3-4 a night for the last few nights. They resolve within a few seconds.Vag. Bleeding: None.  Movement: Present. Denies leaking of fluid. She has also been rearranging furniture and believes this may be causing more cramping.   Patient has noticed full body itching for the last week including palms and soles without any new skin changes. No changes in detergent or clothing. No dry skin. Has not had this symptoms with other pregnancies. Has not changed medications or started supplements.   Tension headaches from earlier in the pregnancy have resolved with prn Excedrin  use.  Has not yet started aspirin .   The following portions of the patient's history were reviewed and updated as appropriate: allergies, current medications, past family history, past medical history, past social history, past surgical history and problem list.   Objective:   Vitals:   07/09/24 1326  BP: 117/64  Pulse: 83  Weight: 134 lb (60.8 kg)    Fetal Status:  Fetal Heart Rate (bpm): 154   Movement: Present    General: Alert, oriented and cooperative. Patient is in no acute distress.  Skin: Skin is warm and dry. No rash noted.   Cardiovascular: Normal heart rate noted  Respiratory: Normal respiratory effort, no problems with respiration noted  Abdomen: Soft, gravid, appropriate for gestational age.  Pain/Pressure: Present     Pelvic: Cervical exam  deferred        Extremities: Normal range of motion.  Edema: Trace  Mental Status: Normal mood and affect. Normal behavior. Normal judgment and thought content.      06/08/2024    2:45 PM 06/03/2024    4:06 PM 06/08/2020    8:43 AM  Depression screen PHQ 2/9  Decreased Interest 0 0   Down, Depressed, Hopeless 0 0   PHQ - 2 Score 0 0   Altered sleeping 1 1   Tired, decreased energy 1 1   Change in appetite 1 2   Feeling bad or failure about yourself  0 0   Trouble concentrating 0 0   Moving slowly or fidgety/restless 0 0   Suicidal thoughts 0 0   PHQ-9 Score 3 4   Difficult doing work/chores Somewhat difficult       Information is confidential and restricted. Go to Review Flowsheets to unlock data.        06/08/2024    2:45 PM 06/03/2024    4:07 PM  GAD 7 : Generalized Anxiety Score  Nervous, Anxious, on Edge 0  0   Control/stop worrying 0  0   Worry too much - different things 0  0   Trouble relaxing 1  0   Restless 0  1   Easily annoyed or irritable 1  1   Afraid - awful might happen 0  0   Total GAD 7 Score 2 2  Anxiety Difficulty Not difficult at all      Data saved with a previous flowsheet row definition  Assessment and Plan:  Pregnancy: G3P2002 at [redacted]w[redacted]d 1. [redacted] weeks gestation of pregnancy (Primary) Taking PNV.  Some uterine irritability likely due to not adequate fluid intake and increased activity.  Anatomy us  scheduled for 07/16/2024.  Low risk panorama this pregnancy.  UTD on pap smear.  Has not yet started asa 81 mg recommended patient start for history of hypertension in pregnancy.   2. Itching Less likely ICP given middle second trimester though still possible.  - CMP, Bile acids  - MAU precautions given.   3. Chronic Hypertension affecting Pregnancy  History of elevated BP. Not on any medications.  BP normal today.  - ASA 81 mg   4. Headaches  Tension headaches noticed one month ago has improved on Excedrin  as needed.  No vision changes.  -  continue to monitor.   Preterm labor symptoms and general obstetric precautions including but not limited to vaginal bleeding, contractions, leaking of fluid and fetal movement were reviewed in detail with the patient. Please refer to After Visit Summary for other counseling recommendations.   No follow-ups on file.  Future Appointments  Date Time Provider Department Center  07/16/2024  8:00 AM [facility] PROVIDER 1 [facility]  07/16/2024  8:30 AM [facility] US1 [facility]  07/29/2024  9:15 AM Dunn, Rollo DASEN, MD [facility] None  08/26/2024  9:15 AM Abigail, Rollo DASEN, MD [facility] None  09/14/2024  8:15 AM Anyanwu, Gloris LABOR, MD [facility] None  09/28/2024  9:15 AM [facility] MIDWIFE [facility] None  10/12/2024  9:15 AM [facility] MIDWIFE [facility] None  10/26/2024  9:15 AM [facility] MIDWIFE [facility] None  11/11/2024  9:15 AM [facility] MIDWIFE [facility] None  11/18/2024  9:15 AM [facility] MIDWIFE [facility] None  11/25/2024  9:15 AM [facility] MIDWIFE [facility] None  12/02/2024  9:15 AM [facility] MIDWIFE [facility] None  12/09/2024  9:15 AM [facility] MIDWIFE [facility] None  02/03/2025  8:40 AM Almarie Waddell NOVAK, NP [id number] [id number], MD  "

## 2024-07-16 ENCOUNTER — Ambulatory Visit

## 2024-07-16 ENCOUNTER — Other Ambulatory Visit

## 2024-07-29 ENCOUNTER — Encounter: Admitting: Obstetrics and Gynecology

## 2024-08-26 ENCOUNTER — Encounter: Admitting: Obstetrics and Gynecology

## 2024-09-14 ENCOUNTER — Encounter: Admitting: Obstetrics & Gynecology

## 2024-09-28 ENCOUNTER — Encounter

## 2024-10-12 ENCOUNTER — Encounter

## 2024-10-26 ENCOUNTER — Encounter

## 2024-11-11 ENCOUNTER — Encounter

## 2024-11-18 ENCOUNTER — Encounter

## 2024-11-25 ENCOUNTER — Encounter

## 2024-12-02 ENCOUNTER — Encounter

## 2024-12-09 ENCOUNTER — Encounter

## 2025-02-03 ENCOUNTER — Encounter: Admitting: Family Medicine
